# Patient Record
Sex: Female | Born: 1985 | Marital: Single | Smoking: Never smoker
Health system: Southern US, Community
[De-identification: ages and names within clinical notes are randomized; demographics above are authoritative.]

## PROBLEM LIST (undated history)

## (undated) DIAGNOSIS — L4050 Arthropathic psoriasis, unspecified: Secondary | ICD-10-CM

## (undated) HISTORY — DX: Arthropathic psoriasis, unspecified: L40.50

## (undated) HISTORY — PX: NO PAST SURGERIES: SHX2092

---

## 2013-12-05 DIAGNOSIS — L4050 Arthropathic psoriasis, unspecified: Secondary | ICD-10-CM | POA: Insufficient documentation

## 2019-10-14 NOTE — L&D Delivery Note (Signed)
Delivery Note At 4:55 AM a viable female was delivered via Vaginal, Spontaneous (Presentation: Left Occiput Anterior).  APGAR:8,9; weight 3260g.   Placenta status: Spontaneous,  .  Cord: 3 vessels with the following complications: None.   Anesthesia: Epidural Episiotomy: none Lacerations: 2nd degree Suture Repair: 2.0 vicryl Est. Blood Loss (mL): 300  Mom to postpartum.  Baby to Couplet care / Skin to Skin.  Delivery: Called to room and patient was complete and pushing. Head delivered LOA. Nuchal and body cord present x1, delivered through. Shoulder and body delivered in usual fashion. Infant with spontaneous cry, placed on mother's abdomen, dried and stimulated. Cord clamped x 2 after 1-minute delay, and cut by myself under my direct supervision. Cord blood drawn. Post placental paraguard IUD placed as per procedure note. Placenta delivered spontaneously with gentle cord traction. Fundus firm with massage and Pitocin. Labia, perineum, vagina, and cervix were inspected, as noted above.    Alric Seton 07/02/2020, 5:20 AM

## 2020-01-05 ENCOUNTER — Ambulatory Visit (INDEPENDENT_AMBULATORY_CARE_PROVIDER_SITE_OTHER): Payer: Medicaid Other | Admitting: Family Medicine

## 2020-01-05 ENCOUNTER — Encounter: Payer: Self-pay | Admitting: Family Medicine

## 2020-01-05 ENCOUNTER — Other Ambulatory Visit: Payer: Self-pay

## 2020-01-05 DIAGNOSIS — Z3687 Encounter for antenatal screening for uncertain dates: Secondary | ICD-10-CM

## 2020-01-05 DIAGNOSIS — Z3401 Encounter for supervision of normal first pregnancy, first trimester: Secondary | ICD-10-CM | POA: Diagnosis not present

## 2020-01-05 DIAGNOSIS — Z3400 Encounter for supervision of normal first pregnancy, unspecified trimester: Secondary | ICD-10-CM

## 2020-01-05 MED ORDER — AMBULATORY NON FORMULARY MEDICATION: 1.0000 | 0 refills | Status: DC

## 2020-01-05 MED ORDER — PRENATAL VITAMIN 27-0.8 MG PO TABS: 0.8000 mg | ORAL_TABLET | Freq: Every day | ORAL | 11 refills | Status: DC

## 2020-01-05 NOTE — Addendum Note (Signed)
Addended by: Mikey Bussing on: 01/05/2020 01:39 PM   Modules accepted: Orders

## 2020-01-05 NOTE — Progress Notes (Signed)
  Subjective:  Nicole King is a G1P0 [redacted]w[redacted]d by [place] today, being seen today for her first obstetrical visit. Desired pregnancy. FOB involved and is patient's partner. Her obstetrical history is significant for first pregnancy. Patient does intend to breast feed. Pregnancy history fully reviewed.  Patient reports no complaints.  BP 118/82   Pulse (!) 104   Ht 5' (1.524 m)   Wt 162 lb 0.6 oz (73.5 kg)   LMP 10/10/2019 (Within Days)   BMI 31.65 kg/m   HISTORY: OB History  Gravida Para Term Preterm AB Living  1            SAB TAB Ectopic Multiple Live Births               # Outcome Date GA Lbr Len/2nd Weight Sex Delivery Anes PTL Lv  1 Current             Past Medical History:  Diagnosis Date  . Psoriatic arthritis (HCC)     History reviewed. No pertinent surgical history.  History reviewed. No pertinent family history.   Exam  BP 118/82   Pulse (!) 104   Ht 5' (1.524 m)   Wt 162 lb 0.6 oz (73.5 kg)   LMP 10/10/2019 (Within Days)   BMI 31.65 kg/m   Chaperone present during exam  CONSTITUTIONAL: Well-developed, well-nourished female in no acute distress.  HENT:  Normocephalic, atraumatic, External right and left ear normal. Oropharynx is clear and moist EYES: Conjunctivae and EOM are normal. Pupils are equal, round, and reactive to light. No scleral icterus.  NECK: Normal range of motion, supple, no masses.  Normal thyroid.  CARDIOVASCULAR: Normal heart rate noted, regular rhythm RESPIRATORY: Clear to auscultation bilaterally. Effort and breath sounds normal, no problems with respiration noted. BREASTS: patient declined exam today - wanted female provider ABDOMEN: Soft, normal bowel sounds, no distention noted.  No tenderness, rebound or guarding.  PELVIC: patient declined exam today - wanted female provider MUSCULOSKELETAL: Normal range of motion. No tenderness.  No cyanosis, clubbing, or edema.  2+ distal pulses. SKIN: Skin is warm and dry. No rash noted. Not  diaphoretic. No erythema. No pallor. NEUROLOGIC: Alert and oriented to person, place, and time. Normal reflexes, muscle tone coordination. No cranial nerve deficit noted. PSYCHIATRIC: Normal mood and affect. Normal behavior. Normal judgment and thought content.    Assessment:    Pregnancy: G1P0 Patient Active Problem List   Diagnosis Date Noted  . Supervision of normal first pregnancy 01/05/2020      Plan:   1. Supervision of normal first pregnancy, antepartum Desires panorama for genetic screening Will schedule [place]. PAP next visit. Discussed diet, avoiding certain foods.  No pets at home. - Culture, OB Urine - Obstetric Panel, Including HIV - SMN1 COPY NUMBER ANALYSIS (SMA Carrier Screen) - Hemoglobinopathy Evaluation - [place] MFM OB COMP + 14 WK; Future - CHL AMB BABYSCRIPTS SCHEDULE OPTIMIZATION - Hepatitis C Antibody - Genetic Screening     Problem list reviewed and updated. 75% of 30 min visit spent on counseling and coordination of care.     Levie Heritage 01/05/2020

## 2020-01-05 NOTE — Progress Notes (Signed)
DATING AND VIABILITY SONOGRAM   Nicole King is a 34 y.o. year old G1P0 with LMP Patient's last menstrual period was 10/13/2019 (exact date). which would correlate to  [redacted]w[redacted]d weeks gestation.  She has regular menstrual cycles.   She is here today for a confirmatory initial sonogram.    GESTATION: SINGLETON    FETAL ACTIVITY:          Heart rate        154 bpm          The fetus is active.     GESTATIONAL AGE AND  BIOMETRICS:  Gestational criteria: Estimated Date of Delivery: 07/19/20 by LMP (unsure of exact date) now at [redacted]w[redacted]d  Previous Scans:0      BPD 3.03 cm 15.4 wks  HC 8.78 cm 13.6wks   FL 1.52 14.3 wks                                                                       AVERAGE EGA(BY THIS SCAN):  14.4 weeks  WORKING EDD( early ultrasound ): 07-01-2020     TECHNICIAN COMMENTS: Patient informed that the ultrasound is considered a limited obstetric ultrasound and is not intended to be a complete ultrasound exam. Patient also informed that the ultrasound is not being completed with the intent of assessing for fetal or placental anomalies or any pelvic abnormalities. Explained that the purpose of today's ultrasound is to assess for fetal heart rate. Patient acknowledges the purpose of the exam and the limitations of the study.     Armandina Stammer 01/05/2020 11:31 AM

## 2020-01-09 ENCOUNTER — Encounter: Payer: Self-pay | Admitting: Family Medicine

## 2020-01-09 DIAGNOSIS — R8271 Bacteriuria: Secondary | ICD-10-CM | POA: Insufficient documentation

## 2020-01-09 LAB — URINE CULTURE, OB REFLEX

## 2020-01-09 LAB — CULTURE, OB URINE

## 2020-01-12 LAB — OBSTETRIC PANEL, INCLUDING HIV
Antibody Screen: NEGATIVE
Basophils Absolute: 0 10*3/uL (ref 0.0–0.2)
Basos: 0 %
EOS (ABSOLUTE): 0.1 10*3/uL (ref 0.0–0.4)
Eos: 1 %
HIV Screen 4th Generation wRfx: NONREACTIVE
Hematocrit: 39.3 % (ref 34.0–46.6)
Hemoglobin: 13.5 g/dL (ref 11.1–15.9)
Hepatitis B Surface Ag: NEGATIVE
Immature Grans (Abs): 0.2 10*3/uL — ABNORMAL HIGH (ref 0.0–0.1)
Immature Granulocytes: 2 %
Lymphocytes Absolute: 1.5 10*3/uL (ref 0.7–3.1)
Lymphs: 13 %
MCH: 32.6 pg (ref 26.6–33.0)
MCHC: 34.4 g/dL (ref 31.5–35.7)
MCV: 95 fL (ref 79–97)
Monocytes Absolute: 0.5 10*3/uL (ref 0.1–0.9)
Monocytes: 4 %
Neutrophils Absolute: 8.9 10*3/uL — ABNORMAL HIGH (ref 1.4–7.0)
Neutrophils: 80 %
Platelets: 363 10*3/uL (ref 150–450)
RBC: 4.14 x10E6/uL (ref 3.77–5.28)
RDW: 11.4 % — ABNORMAL LOW (ref 11.7–15.4)
RPR Ser Ql: NONREACTIVE
Rh Factor: POSITIVE
Rubella Antibodies, IGG: 1.35 index (ref >0.99)
WBC: 11.2 10*3/uL — ABNORMAL HIGH (ref 3.4–10.8)

## 2020-01-12 LAB — HEMOGLOBPATHY+FER W/A THAL RFX
Ferritin: 349 ng/mL — ABNORMAL HIGH (ref 15–150)
Hgb A2: 2.7 % (ref 1.8–3.2)
Hgb A: 97.3 % (ref 96.4–98.8)
Hgb F: 0 % (ref 0.0–2.0)
Hgb S: 0 %

## 2020-01-12 LAB — SMN1 COPY NUMBER ANALYSIS (SMA CARRIER SCREENING)

## 2020-01-12 LAB — HEPATITIS C ANTIBODY: Hep C Virus Ab: <0.1 s/co ratio (ref 0.0–0.9)

## 2020-01-18 ENCOUNTER — Telehealth: Payer: Self-pay

## 2020-01-18 NOTE — Telephone Encounter (Signed)
Attempted to reach patient by phone for panorama results. Armandina Stammer RN

## 2020-01-30 ENCOUNTER — Encounter: Payer: Self-pay | Admitting: Advanced Practice Midwife

## 2020-02-06 ENCOUNTER — Ambulatory Visit (HOSPITAL_COMMUNITY): Payer: Medicaid Other

## 2020-02-08 ENCOUNTER — Encounter: Payer: Medicaid Other | Admitting: Obstetrics & Gynecology

## 2020-02-23 ENCOUNTER — Other Ambulatory Visit: Payer: Self-pay | Admitting: *Deleted

## 2020-02-23 ENCOUNTER — Ambulatory Visit (INDEPENDENT_AMBULATORY_CARE_PROVIDER_SITE_OTHER): Payer: Medicaid Other | Admitting: Obstetrics & Gynecology

## 2020-02-23 ENCOUNTER — Other Ambulatory Visit: Payer: Self-pay

## 2020-02-23 ENCOUNTER — Ambulatory Visit: Payer: Medicaid Other | Attending: Family Medicine

## 2020-02-23 VITALS — BP 127/86 | HR 114 | Wt 162.0 lb

## 2020-02-23 DIAGNOSIS — E669 Obesity, unspecified: Secondary | ICD-10-CM

## 2020-02-23 DIAGNOSIS — Z362 Encounter for other antenatal screening follow-up: Secondary | ICD-10-CM

## 2020-02-23 DIAGNOSIS — Z3400 Encounter for supervision of normal first pregnancy, unspecified trimester: Secondary | ICD-10-CM | POA: Diagnosis present

## 2020-02-23 DIAGNOSIS — Z3687 Encounter for antenatal screening for uncertain dates: Secondary | ICD-10-CM | POA: Diagnosis not present

## 2020-02-23 DIAGNOSIS — Z3A21 21 weeks gestation of pregnancy: Secondary | ICD-10-CM

## 2020-02-23 DIAGNOSIS — Z363 Encounter for antenatal screening for malformations: Secondary | ICD-10-CM | POA: Diagnosis not present

## 2020-02-23 DIAGNOSIS — R8271 Bacteriuria: Secondary | ICD-10-CM

## 2020-02-23 DIAGNOSIS — O99212 Obesity complicating pregnancy, second trimester: Secondary | ICD-10-CM

## 2020-02-23 MED ORDER — PRENATAL VITAMIN 27-0.8 MG PO TABS: 0.8000 mg | ORAL_TABLET | Freq: Every day | ORAL | 11 refills | Status: DC

## 2020-02-23 NOTE — Patient Instructions (Signed)

## 2020-02-23 NOTE — Progress Notes (Addendum)
   PRENATAL VISIT NOTE  Subjective:  Nicole King is a 34 y.o. G1P0 at [redacted]w[redacted]d being seen today for ongoing prenatal care.  She is currently monitored for the following issues for this low-risk pregnancy and has Supervision of normal first pregnancy and GBS bacteriuria on their problem list.  Patient reports no complaints.  Contractions: Not present. Vag. Bleeding: None.  Movement: Present. Denies leaking of fluid.   The following portions of the patient's history were reviewed and updated as appropriate: allergies, current medications, past family history, past medical history, past social history, past surgical history and problem list.   Objective:   Vitals:   02/23/20 1122  BP: 127/86  Pulse: (!) 114  Weight: 162 lb (73.5 kg)    Fetal Status: Fetal Heart Rate (bpm): 141   Movement: Present     General:  Alert, oriented and cooperative. Patient is in no acute distress.  Skin: Skin is warm and dry. No rash noted.   Cardiovascular: Normal heart rate noted  Respiratory: Normal respiratory effort, no problems with respiration noted  Abdomen: Soft, gravid, appropriate for gestational age.  Pain/Pressure: Absent     Pelvic: Cervical exam deferred        Extremities: Normal range of motion.  Edema: None  Mental Status: Normal mood and affect. Normal behavior. Normal judgment and thought content.   Assessment and Plan:  Pregnancy: G1P0 at [redacted]w[redacted]d 1. Supervision of normal first pregnancy, antepartum FH and FHR WNL  - AFP, Serum, Open Spina Bifida  Scheduled for anatomy scan this afternoon.   2. GBS bacteriuria Reviewed with pt need for atbx in labor.   Preterm labor symptoms and general obstetric precautions including but not limited to vaginal bleeding, contractions, leaking of fluid and fetal movement were reviewed in detail with the patient. Please refer to After Visit Summary for other counseling recommendations.   Return in about 4 weeks (around 03/22/2020) for in  person.  Future Appointments  Date Time Provider Department Center  02/23/2020  1:00 PM [facility] US1 [facility]    Willodean Rosenthal, MD

## 2020-02-25 LAB — AFP, SERUM, OPEN SPINA BIFIDA
AFP MoM: 1.77
AFP Value: 117.4 ng/mL
Gest. Age on Collection Date: 21.4 weeks
Maternal Age At EDD: 34.4 yr
OSBR Risk 1 IN: 1373
Test Results:: NEGATIVE
Weight: 162 [lb_av]

## 2020-03-02 ENCOUNTER — Other Ambulatory Visit: Payer: Self-pay | Admitting: Family Medicine

## 2020-03-02 DIAGNOSIS — Z3400 Encounter for supervision of normal first pregnancy, unspecified trimester: Secondary | ICD-10-CM

## 2020-03-22 ENCOUNTER — Ambulatory Visit: Payer: Medicaid Other | Admitting: *Deleted

## 2020-03-22 ENCOUNTER — Other Ambulatory Visit: Payer: Self-pay

## 2020-03-22 ENCOUNTER — Ambulatory Visit: Payer: Medicaid Other | Attending: Obstetrics and Gynecology

## 2020-03-22 ENCOUNTER — Ambulatory Visit (INDEPENDENT_AMBULATORY_CARE_PROVIDER_SITE_OTHER): Payer: Medicaid Other | Admitting: Family Medicine

## 2020-03-22 VITALS — BP 111/67 | HR 112 | Wt 162.0 lb

## 2020-03-22 VITALS — BP 125/78 | HR 116

## 2020-03-22 DIAGNOSIS — B951 Streptococcus, group B, as the cause of diseases classified elsewhere: Secondary | ICD-10-CM

## 2020-03-22 DIAGNOSIS — Z3400 Encounter for supervision of normal first pregnancy, unspecified trimester: Secondary | ICD-10-CM

## 2020-03-22 DIAGNOSIS — Z3687 Encounter for antenatal screening for uncertain dates: Secondary | ICD-10-CM

## 2020-03-22 DIAGNOSIS — O26892 Other specified pregnancy related conditions, second trimester: Secondary | ICD-10-CM

## 2020-03-22 DIAGNOSIS — O99212 Obesity complicating pregnancy, second trimester: Secondary | ICD-10-CM

## 2020-03-22 DIAGNOSIS — E669 Obesity, unspecified: Secondary | ICD-10-CM

## 2020-03-22 DIAGNOSIS — Z3A25 25 weeks gestation of pregnancy: Secondary | ICD-10-CM

## 2020-03-22 DIAGNOSIS — O26899 Other specified pregnancy related conditions, unspecified trimester: Secondary | ICD-10-CM

## 2020-03-22 DIAGNOSIS — Z3492 Encounter for supervision of normal pregnancy, unspecified, second trimester: Secondary | ICD-10-CM

## 2020-03-22 DIAGNOSIS — R8271 Bacteriuria: Secondary | ICD-10-CM

## 2020-03-22 DIAGNOSIS — G5600 Carpal tunnel syndrome, unspecified upper limb: Secondary | ICD-10-CM

## 2020-03-22 DIAGNOSIS — Z362 Encounter for other antenatal screening follow-up: Secondary | ICD-10-CM | POA: Insufficient documentation

## 2020-03-22 DIAGNOSIS — O98812 Other maternal infectious and parasitic diseases complicating pregnancy, second trimester: Secondary | ICD-10-CM

## 2020-03-22 MED ORDER — WRIST SPLINT/COCK-UP/RIGHT M MISC
1.0000 [IU] | Freq: Every day | 0 refills | Status: DC
Start: 2020-03-22 — End: 2020-07-03

## 2020-03-22 MED ORDER — WRIST SPLINT/COCK-UP/LEFT M MISC
1.0000 [IU] | Freq: Every day | 0 refills | Status: DC
Start: 2020-03-22 — End: 2020-07-03

## 2020-03-22 NOTE — Progress Notes (Signed)
   PRENATAL VISIT NOTE  Subjective:  Nicole King is a 34 y.o. G1P0 at [redacted]w[redacted]d being seen today for ongoing prenatal care.  She is currently monitored for the following issues for this low-risk pregnancy and has Supervision of normal first pregnancy and GBS bacteriuria on their problem list.  Patient reports carpal tunnel symptoms.  Contractions: Not present. Vag. Bleeding: None.  Movement: Present. Denies leaking of fluid.   The following portions of the patient's history were reviewed and updated as appropriate: allergies, current medications, past family history, past medical history, past social history, past surgical history and problem list.   Objective:   Vitals:   03/22/20 1434  BP: 111/67  Pulse: (!) 112  Weight: 162 lb (73.5 kg)    Fetal Status: Fetal Heart Rate (bpm): 144   Movement: Present     General:  Alert, oriented and cooperative. Patient is in no acute distress.  Skin: Skin is warm and dry. No rash noted.   Cardiovascular: Normal heart rate noted  Respiratory: Normal respiratory effort, no problems with respiration noted  Abdomen: Soft, gravid, appropriate for gestational age.  Pain/Pressure: Present     Pelvic: Cervical exam deferred        Extremities: Normal range of motion.  Edema: Trace  Mental Status: Normal mood and affect. Normal behavior. Normal judgment and thought content.   Assessment and Plan:  Pregnancy: G1P0 at [redacted]w[redacted]d 1. Supervision of normal first pregnancy, antepartum FHT and FH normal. 28 wk labs next app.  2. GBS bacteriuria Intrapartum ppx  3. Carpal tunnel syndrome during pregnancy Splints prescribed  Preterm labor symptoms and general obstetric precautions including but not limited to vaginal bleeding, contractions, leaking of fluid and fetal movement were reviewed in detail with the patient. Please refer to After Visit Summary for other counseling recommendations.   Return in about 3 weeks (around 04/12/2020) for OB f/u, 2 hr GTT,  In Office.  Future Appointments  Date Time Provider Department Center  04/12/2020  9:30 AM Levie Heritage, DO [facility] None    Levie Heritage, DO

## 2020-04-12 ENCOUNTER — Ambulatory Visit (INDEPENDENT_AMBULATORY_CARE_PROVIDER_SITE_OTHER): Payer: Medicaid Other | Admitting: Family Medicine

## 2020-04-12 ENCOUNTER — Other Ambulatory Visit: Payer: Self-pay

## 2020-04-12 VITALS — BP 110/85 | HR 113 | Wt 167.0 lb

## 2020-04-12 DIAGNOSIS — R8271 Bacteriuria: Secondary | ICD-10-CM

## 2020-04-12 DIAGNOSIS — O99820 Streptococcus B carrier state complicating pregnancy: Secondary | ICD-10-CM

## 2020-04-12 DIAGNOSIS — Z3400 Encounter for supervision of normal first pregnancy, unspecified trimester: Secondary | ICD-10-CM

## 2020-04-12 DIAGNOSIS — Z3A28 28 weeks gestation of pregnancy: Secondary | ICD-10-CM

## 2020-04-12 NOTE — Progress Notes (Signed)
   PRENATAL VISIT NOTE  Subjective:  Nicole King is a 34 y.o. G1P0 at [redacted]w[redacted]d being seen today for ongoing prenatal care.  She is currently monitored for the following issues for this low-risk pregnancy and has Supervision of normal first pregnancy and GBS bacteriuria on their problem list.  Patient reports no complaints.  Contractions: Not present. Vag. Bleeding: None.  Movement: Present. Denies leaking of fluid.   The following portions of the patient's history were reviewed and updated as appropriate: allergies, current medications, past family history, past medical history, past social history, past surgical history and problem list.   Objective:   Vitals:   04/12/20 0938  BP: 110/85  Pulse: (!) 113  Weight: 167 lb (75.8 kg)    Fetal Status: Fetal Heart Rate (bpm): 141 Fundal Height: 28 cm Movement: Present     General:  Alert, oriented and cooperative. Patient is in no acute distress.  Skin: Skin is warm and dry. No rash noted.   Cardiovascular: Normal heart rate noted  Respiratory: Normal respiratory effort, no problems with respiration noted  Abdomen: Soft, gravid, appropriate for gestational age.  Pain/Pressure: Absent     Pelvic: Cervical exam deferred        Extremities: Normal range of motion.  Edema: Trace  Mental Status: Normal mood and affect. Normal behavior. Normal judgment and thought content.   Assessment and Plan:  Pregnancy: G1P0 at [redacted]w[redacted]d  1. Supervision of normal first pregnancy, antepartum FHT and FH normal  2. GBS bacteriuria Intrapartum ppx   Preterm labor symptoms and general obstetric precautions including but not limited to vaginal bleeding, contractions, leaking of fluid and fetal movement were reviewed in detail with the patient. Please refer to After Visit Summary for other counseling recommendations.   Return in about 2 weeks (around 04/26/2020) for OB f/u.  Future Appointments  Date Time Provider Department Center  04/17/2020  9:30 AM  [facility] NURSE [facility] None    Levie Heritage, DO

## 2020-04-13 ENCOUNTER — Encounter: Payer: Medicaid Other | Admitting: Family Medicine

## 2020-04-17 ENCOUNTER — Ambulatory Visit: Payer: Medicaid Other

## 2020-04-25 ENCOUNTER — Ambulatory Visit (INDEPENDENT_AMBULATORY_CARE_PROVIDER_SITE_OTHER): Payer: Medicaid Other

## 2020-04-25 ENCOUNTER — Other Ambulatory Visit: Payer: Self-pay

## 2020-04-25 DIAGNOSIS — Z3403 Encounter for supervision of normal first pregnancy, third trimester: Secondary | ICD-10-CM

## 2020-04-25 DIAGNOSIS — Z3400 Encounter for supervision of normal first pregnancy, unspecified trimester: Secondary | ICD-10-CM

## 2020-04-25 DIAGNOSIS — Z3A30 30 weeks gestation of pregnancy: Secondary | ICD-10-CM

## 2020-04-25 NOTE — Progress Notes (Signed)
Pt presents for 2 hr GTT. Pt was given lab slip and sent to the lab.  Nicole King l Babette Stum, CMA 

## 2020-04-26 ENCOUNTER — Ambulatory Visit (INDEPENDENT_AMBULATORY_CARE_PROVIDER_SITE_OTHER): Payer: Medicaid Other | Admitting: Family Medicine

## 2020-04-26 VITALS — BP 107/74 | HR 122 | Wt 171.0 lb

## 2020-04-26 DIAGNOSIS — Z3A30 30 weeks gestation of pregnancy: Secondary | ICD-10-CM

## 2020-04-26 DIAGNOSIS — O99820 Streptococcus B carrier state complicating pregnancy: Secondary | ICD-10-CM

## 2020-04-26 DIAGNOSIS — O24410 Gestational diabetes mellitus in pregnancy, diet controlled: Secondary | ICD-10-CM

## 2020-04-26 DIAGNOSIS — R8271 Bacteriuria: Secondary | ICD-10-CM

## 2020-04-26 DIAGNOSIS — Z3400 Encounter for supervision of normal first pregnancy, unspecified trimester: Secondary | ICD-10-CM

## 2020-04-26 LAB — CBC
Hematocrit: 36.3 % (ref 34.0–46.6)
Hemoglobin: 11.8 g/dL (ref 11.1–15.9)
MCH: 29.2 pg (ref 26.6–33.0)
MCHC: 32.5 g/dL (ref 31.5–35.7)
MCV: 90 fL (ref 79–97)
Platelets: 302 10*3/uL (ref 150–450)
RBC: 4.04 x10E6/uL (ref 3.77–5.28)
RDW: 12.6 % (ref 11.7–15.4)
WBC: 10.3 10*3/uL (ref 3.4–10.8)

## 2020-04-26 LAB — GLUCOSE TOLERANCE, 2 HOURS W/ 1HR
Glucose, 1 hour: 247 mg/dL — ABNORMAL HIGH (ref 65–179)
Glucose, 2 hour: 209 mg/dL — ABNORMAL HIGH (ref 65–152)
Glucose, Fasting: 116 mg/dL — ABNORMAL HIGH (ref 65–91)

## 2020-04-26 LAB — RPR: RPR Ser Ql: NONREACTIVE

## 2020-04-26 LAB — HIV ANTIBODY (ROUTINE TESTING W REFLEX): HIV Screen 4th Generation wRfx: NONREACTIVE

## 2020-04-26 MED ORDER — ACCU-CHEK GUIDE W/DEVICE KIT: 1.0000 | PACK | Freq: Once | 0 refills | Status: AC

## 2020-04-26 MED ORDER — ACCU-CHEK SOFTCLIX LANCETS MISC: 100.0000 | Freq: Four times a day (QID) | 12 refills | Status: DC

## 2020-04-26 MED ORDER — ACCU-CHEK GUIDE VI STRP
ORAL_STRIP | 12 refills | Status: DC
Start: 2020-04-26 — End: 2020-07-03

## 2020-04-26 MED FILL — ACCU-CHEK SOFTCLIX LANCETS: 25 days supply | Qty: 100 | Fill #0

## 2020-04-26 MED FILL — ACCU-CHEK GUIDE TEST STRIP: 25 days supply | Qty: 100 | Fill #0

## 2020-04-26 MED FILL — ACCU-CHEK GUIDE W/DEVICE KI: W/DEVICE | 30 days supply | Qty: 1 | Fill #0

## 2020-04-26 NOTE — Progress Notes (Signed)
   PRENATAL VISIT NOTE  Subjective:  Nicole King is a 34 y.o. G1P0 at 7w4dbeing seen today for ongoing prenatal care.  She is currently monitored for the following issues for this high-risk pregnancy and has Supervision of normal first pregnancy; GBS bacteriuria; and Diet controlled gestational diabetes mellitus (GDM), antepartum on their problem list.  Patient reports no complaints.  Contractions: Not present. Vag. Bleeding: None.  Movement: Present. Denies leaking of fluid.   The following portions of the patient's history were reviewed and updated as appropriate: allergies, current medications, past family history, past medical history, past social history, past surgical history and problem list.   Objective:   Vitals:   04/26/20 1559  BP: 107/74  Pulse: (!) 122  Weight: 171 lb (77.6 kg)    Fetal Status: Fetal Heart Rate (bpm): 144   Movement: Present     General:  Alert, oriented and cooperative. Patient is in no acute distress.  Skin: Skin is warm and dry. No rash noted.   Cardiovascular: Normal heart rate noted  Respiratory: Normal respiratory effort, no problems with respiration noted  Abdomen: Soft, gravid, appropriate for gestational age.  Pain/Pressure: Present     Pelvic: Cervical exam deferred        Extremities: Normal range of motion.  Edema: Trace  Mental Status: Normal mood and affect. Normal behavior. Normal judgment and thought content.   Assessment and Plan:  Pregnancy: G1P0 at 322w4d. Supervision of normal first pregnancy, antepartum FHT and FH normal  2. Diet controlled gestational diabetes mellitus (GDM), antepartum Discussed diagnosis. Start testing. Discussed goals - glucose blood (ACCU-CHEK GUIDE) test strip; DX: O24.419 check BS QID  Dispense: 100 each; Refill: 12 - Blood Glucose Monitoring Suppl (ACCU-CHEK GUIDE) w/Device KIT; 1 Device by Does not apply route once for 1 dose. DX: O24.419 check BS QID  Dispense: 1 kit; Refill: 0 - Accu-Chek  Softclix Lancets lancets; 100 each by Other route 4 (four) times daily. DX: O24.419 check BS QID  Dispense: 100 each; Refill: 12 - Ambulatory referral to Nutrition and Diabetic Education  3. GBS bacteriuria Intrapartum PPx  Preterm labor symptoms and general obstetric precautions including but not limited to vaginal bleeding, contractions, leaking of fluid and fetal movement were reviewed in detail with the patient. Please refer to After Visit Summary for other counseling recommendations.   Return in about 2 weeks (around 05/10/2020) for OB f/u.  Future Appointments  Date Time Provider [place]7/27/2021 11:10 AM WiSeabron SpatesCNM [facility] None  05/22/2020 11:05 AM Rasch, JeArtist PaisNP [facility] None    JaTruett MainlandDO

## 2020-05-08 ENCOUNTER — Encounter: Payer: Medicaid Other | Admitting: Advanced Practice Midwife

## 2020-05-17 ENCOUNTER — Ambulatory Visit (INDEPENDENT_AMBULATORY_CARE_PROVIDER_SITE_OTHER): Payer: Medicaid Other | Admitting: Family Medicine

## 2020-05-17 ENCOUNTER — Other Ambulatory Visit: Payer: Self-pay

## 2020-05-17 VITALS — BP 126/81 | HR 109 | Wt 170.0 lb

## 2020-05-17 DIAGNOSIS — Z3400 Encounter for supervision of normal first pregnancy, unspecified trimester: Secondary | ICD-10-CM

## 2020-05-17 DIAGNOSIS — O24415 Gestational diabetes mellitus in pregnancy, controlled by oral hypoglycemic drugs: Secondary | ICD-10-CM

## 2020-05-17 DIAGNOSIS — R8271 Bacteriuria: Secondary | ICD-10-CM

## 2020-05-17 MED ORDER — METFORMIN HCL 500 MG PO TABS: 500.0000 mg | ORAL_TABLET | Freq: Every day | ORAL | 5 refills | Status: DC

## 2020-05-17 MED FILL — ACCU-CHEK GUIDE TEST STRIP: 25 days supply | Qty: 100 | Fill #1

## 2020-05-17 MED FILL — METFORMIN HCL 500 MG TABS: 500 | 30 days supply | Qty: 30 | Fill #0

## 2020-05-17 NOTE — Progress Notes (Signed)
Patient refused her Diabetes and nutrition appointment. Patient has been taking her blood sugars and has blood sugar log. Patient also having trouble with her psorosis on her belly. Armandina Stammer RN

## 2020-05-17 NOTE — Progress Notes (Signed)
   PRENATAL VISIT NOTE  Subjective:  Nicole King is a 34 y.o. G1P0 at [redacted]w[redacted]d being seen today for ongoing prenatal care.  She is currently monitored for the following issues for this high-risk pregnancy and has Supervision of normal first pregnancy; GBS bacteriuria; and Diet controlled gestational diabetes mellitus (GDM), antepartum on their problem list.  Patient reports occasional contractions.  Contractions: Not present. Vag. Bleeding: None.  Movement: Present. Denies leaking of fluid.   GDM: Patient diet controlled.  Fasting: initally, fastings above 100, but was snacking in the middle of the night. 2hr PP: mostly in the 130s-140s.    The following portions of the patient's history were reviewed and updated as appropriate: allergies, current medications, past family history, past medical history, past social history, past surgical history and problem list.   Objective:   Vitals:   05/17/20 1555  BP: 126/81  Pulse: (!) 109  Weight: 170 lb (77.1 kg)    Fetal Status: Fetal Heart Rate (bpm): 145   Movement: Present     General:  Alert, oriented and cooperative. Patient is in no acute distress.  Skin: Skin is warm and dry. No rash noted.   Cardiovascular: Normal heart rate noted  Respiratory: Normal respiratory effort, no problems with respiration noted  Abdomen: Soft, gravid, appropriate for gestational age.  Pain/Pressure: Present     Pelvic: Cervical exam deferred        Extremities: Normal range of motion.  Edema: Trace  Mental Status: Normal mood and affect. Normal behavior. Normal judgment and thought content.   Assessment and Plan:  Pregnancy: G1P0 at [redacted]w[redacted]d 1. Supervision of normal first pregnancy, antepartum FHT and FH normal  2. GBS bacteriuria Intrapartum PPx  3. Gestational diabetes mellitus (GDM) controlled on oral hypoglycemic drug, antepartum Discussed improving diet. Will start metformin 500mg  with breakfast. Start weekly BPP. Induce at 39 weeks. -  MFM FETAL BPP WO NON STRESS; Future  Preterm labor symptoms and general obstetric precautions including but not limited to vaginal bleeding, contractions, leaking of fluid and fetal movement were reviewed in detail with the patient. Please refer to After Visit Summary for other counseling recommendations.   Return in about 2 weeks (around 05/31/2020).  Future Appointments  Date Time Provider Department Center  05/30/2020  2:45 PM 06/01/2020, MD [facility] None    Conan Bowens, DO

## 2020-05-22 ENCOUNTER — Ambulatory Visit: Payer: Medicaid Other

## 2020-05-22 ENCOUNTER — Encounter: Payer: Medicaid Other | Admitting: Obstetrics and Gynecology

## 2020-05-30 ENCOUNTER — Ambulatory Visit (INDEPENDENT_AMBULATORY_CARE_PROVIDER_SITE_OTHER): Payer: Medicaid Other | Admitting: Obstetrics and Gynecology

## 2020-05-30 ENCOUNTER — Ambulatory Visit (HOSPITAL_BASED_OUTPATIENT_CLINIC_OR_DEPARTMENT_OTHER): Payer: Medicaid Other

## 2020-05-30 ENCOUNTER — Encounter: Payer: Self-pay | Admitting: Obstetrics and Gynecology

## 2020-05-30 ENCOUNTER — Other Ambulatory Visit: Payer: Self-pay | Admitting: *Deleted

## 2020-05-30 ENCOUNTER — Other Ambulatory Visit: Payer: Self-pay

## 2020-05-30 ENCOUNTER — Ambulatory Visit: Payer: Medicaid Other | Attending: Family Medicine | Admitting: *Deleted

## 2020-05-30 VITALS — BP 120/77 | HR 117

## 2020-05-30 VITALS — BP 122/80 | HR 110 | Wt 169.0 lb

## 2020-05-30 DIAGNOSIS — Z3A35 35 weeks gestation of pregnancy: Secondary | ICD-10-CM

## 2020-05-30 DIAGNOSIS — O24415 Gestational diabetes mellitus in pregnancy, controlled by oral hypoglycemic drugs: Secondary | ICD-10-CM | POA: Diagnosis not present

## 2020-05-30 DIAGNOSIS — E669 Obesity, unspecified: Secondary | ICD-10-CM | POA: Insufficient documentation

## 2020-05-30 DIAGNOSIS — Z362 Encounter for other antenatal screening follow-up: Secondary | ICD-10-CM | POA: Diagnosis not present

## 2020-05-30 DIAGNOSIS — O99213 Obesity complicating pregnancy, third trimester: Secondary | ICD-10-CM | POA: Diagnosis not present

## 2020-05-30 DIAGNOSIS — Z3403 Encounter for supervision of normal first pregnancy, third trimester: Secondary | ICD-10-CM

## 2020-05-30 DIAGNOSIS — R8271 Bacteriuria: Secondary | ICD-10-CM

## 2020-05-30 DIAGNOSIS — O24410 Gestational diabetes mellitus in pregnancy, diet controlled: Secondary | ICD-10-CM

## 2020-05-30 MED FILL — ACCU-CHEK SOFTCLIX LANCETS: 25 days supply | Qty: 100 | Fill #1

## 2020-05-30 NOTE — Progress Notes (Signed)
   PRENATAL VISIT NOTE  Subjective:  Nicole King is a 34 y.o. G1P0 at [redacted]w[redacted]d being seen today for ongoing prenatal care.  She is currently monitored for the following issues for this high-risk pregnancy and has Supervision of normal first pregnancy; GBS bacteriuria; and Diet controlled gestational diabetes mellitus (GDM), antepartum on their problem list.  Patient reports occasional contractions.  Contractions: Irregular. Vag. Bleeding: None.  Movement: Present. Denies leaking of fluid.   The following portions of the patient's history were reviewed and updated as appropriate: allergies, current medications, past family history, past medical history, past social history, past surgical history and problem list.   Objective:   Vitals:   05/30/20 1433  BP: 122/80  Pulse: (!) 110  Weight: 169 lb (76.7 kg)    Fetal Status: Fetal Heart Rate (bpm): 141   Movement: Present     General:  Alert, oriented and cooperative. Patient is in no acute distress.  Skin: Skin is warm and dry. No rash noted.   Cardiovascular: Normal heart rate noted  Respiratory: Normal respiratory effort, no problems with respiration noted  Abdomen: Soft, gravid, appropriate for gestational age.  Pain/Pressure: Present     Pelvic: Cervical exam deferred        Extremities: Normal range of motion.  Edema: Trace  Mental Status: Normal mood and affect. Normal behavior. Normal judgment and thought content.   Assessment and Plan:  Pregnancy: G1P0 at [redacted]w[redacted]d  1. Encounter for supervision of normal first pregnancy in third trimester [place] here for care, encouraged patient to get medical records to keep copy so if she presents to outside facility, she will have copy of records  2. GBS bacteriuria ppx in labor  3. Diet controlled gestational diabetes mellitus (GDM), antepartum Reviewed paper log, all within range, pt reports she is not taking metformin and is monitoring her  diet Cont diet control  4. [redacted] weeks gestation of pregnancy    Preterm labor symptoms and general obstetric precautions including but not limited to vaginal bleeding, contractions, leaking of fluid and fetal movement were reviewed in detail with the patient. Please refer to After Visit Summary for other counseling recommendations.   Return in about 1 week (around 06/06/2020) for high OB, in person.  Future Appointments  Date Time Provider Department Center  06/06/2020  1:00 PM Reva Bores, MD [facility] None  06/06/2020  3:15 PM [facility] US2 [facility]  06/13/2020  1:30 PM [facility] US[facility]  06/20/2020  1:45 PM [facility] US6 [facility]    Conan Bowens, MD

## 2020-05-30 NOTE — Progress Notes (Signed)
Pt psoriasis has gotten worse on stomach

## 2020-06-06 ENCOUNTER — Other Ambulatory Visit (HOSPITAL_COMMUNITY)
Admission: RE | Admit: 2020-06-06 | Discharge: 2020-06-06 | Disposition: A | Discharge status: Home / Self Care | Payer: Medicaid Other | Source: Ambulatory Visit | Attending: Family Medicine | Admitting: Family Medicine

## 2020-06-06 ENCOUNTER — Ambulatory Visit: Payer: Medicaid Other | Admitting: *Deleted

## 2020-06-06 ENCOUNTER — Other Ambulatory Visit: Payer: Self-pay

## 2020-06-06 ENCOUNTER — Encounter: Payer: Self-pay | Admitting: Family Medicine

## 2020-06-06 ENCOUNTER — Ambulatory Visit (INDEPENDENT_AMBULATORY_CARE_PROVIDER_SITE_OTHER): Payer: Medicaid Other | Admitting: Family Medicine

## 2020-06-06 ENCOUNTER — Ambulatory Visit: Payer: Medicaid Other | Attending: Maternal & Fetal Medicine

## 2020-06-06 VITALS — BP 124/71 | HR 105 | Wt 165.0 lb

## 2020-06-06 DIAGNOSIS — O99820 Streptococcus B carrier state complicating pregnancy: Secondary | ICD-10-CM

## 2020-06-06 DIAGNOSIS — O99213 Obesity complicating pregnancy, third trimester: Secondary | ICD-10-CM | POA: Diagnosis not present

## 2020-06-06 DIAGNOSIS — Z7984 Long term (current) use of oral hypoglycemic drugs: Secondary | ICD-10-CM | POA: Diagnosis not present

## 2020-06-06 DIAGNOSIS — Z3403 Encounter for supervision of normal first pregnancy, third trimester: Secondary | ICD-10-CM | POA: Diagnosis present

## 2020-06-06 DIAGNOSIS — Z3A36 36 weeks gestation of pregnancy: Secondary | ICD-10-CM

## 2020-06-06 DIAGNOSIS — O24410 Gestational diabetes mellitus in pregnancy, diet controlled: Secondary | ICD-10-CM

## 2020-06-06 DIAGNOSIS — R8271 Bacteriuria: Secondary | ICD-10-CM

## 2020-06-06 DIAGNOSIS — O24415 Gestational diabetes mellitus in pregnancy, controlled by oral hypoglycemic drugs: Secondary | ICD-10-CM

## 2020-06-06 LAB — POCT URINALYSIS DIPSTICK OB
Glucose, UA: NEGATIVE
Ketones, UA: NEGATIVE
Leukocytes, UA: NEGATIVE
Nitrite, UA: NEGATIVE
Spec Grav, UA: 1.02 (ref 1.010–1.025)
pH, UA: 7 (ref 5.0–8.0)

## 2020-06-06 NOTE — Progress Notes (Addendum)
   PRENATAL VISIT NOTE  Subjective:  Nicole King is a 34 y.o. G1P0 at [redacted]w[redacted]d being seen today for ongoing prenatal care.  She is currently monitored for the following issues for this low-risk pregnancy and has Supervision of normal first pregnancy; GBS bacteriuria; Diet controlled gestational diabetes mellitus (GDM), antepartum; and Psoriasis with arthropathy (HCC) on their problem list.  Patient reports joint tenderness, stiffness.  Contractions: Irregular. Vag. Bleeding: None.  Movement: Present. Denies leaking of fluid.   The following portions of the patient's history were reviewed and updated as appropriate: allergies, current medications, past family history, past medical history, past social history, past surgical history and problem list.   Objective:   Vitals:   06/06/20 1303  BP: 124/71  Pulse: (!) 105  Weight: 165 lb (74.8 kg)    Fetal Status: Fetal Heart Rate (bpm): 140 Fundal Height: 33 cm Movement: Present  Presentation: Vertex  General:  Alert, oriented and cooperative. Patient is in no acute distress.  Skin: Psoriatic plaques noted diffusely  Cardiovascular: Normal heart rate noted  Respiratory: Normal respiratory effort, no problems with respiration noted  Abdomen: Soft, gravid, appropriate for gestational age.  Pain/Pressure: Present     Pelvic: Cervical exam deferred        Extremities: Normal range of motion.  Edema: None  Mental Status: Normal mood and affect. Normal behavior. Normal judgment and thought content.   Assessment and Plan:  Pregnancy: G1P0 at [redacted]w[redacted]d 1. [redacted] weeks gestation of pregnancy   2. Diet controlled gestational diabetes mellitus (GDM), antepartum No book, reports CBGs are improved with diet - POC Urinalysis Dipstick OB  3. Encounter for supervision of normal first pregnancy in third trimester GBS in urine-->needs treatment in labor - GC/Chlamydia probe amp ()not at [facility] in [place] 3 hours from here--plans delivery  here for now--consider IOL at 39 wks due to this--she will consdier  4. GBS bacteriuria   Preterm labor symptoms and general obstetric precautions including but not limited to vaginal bleeding, contractions, leaking of fluid and fetal movement were reviewed in detail with the patient. Please refer to After Visit Summary for other counseling recommendations.   Return in 1 week (on 06/13/2020) for in person.  Future Appointments  Date Time Provider Department Center  06/14/2020  1:45 PM Levie Heritage, DO [facility] None    Reva Bores, MD

## 2020-06-06 NOTE — Patient Instructions (Signed)

## 2020-06-07 LAB — GC/CHLAMYDIA PROBE AMP (~~LOC~~) NOT AT ARMC
Chlamydia: NEGATIVE
Comment: NEGATIVE
Comment: NORMAL
Neisseria Gonorrhea: NEGATIVE

## 2020-06-13 ENCOUNTER — Ambulatory Visit: Payer: Medicaid Other

## 2020-06-14 ENCOUNTER — Other Ambulatory Visit: Payer: Self-pay

## 2020-06-14 ENCOUNTER — Ambulatory Visit (INDEPENDENT_AMBULATORY_CARE_PROVIDER_SITE_OTHER): Payer: Medicaid Other | Admitting: Family Medicine

## 2020-06-14 VITALS — BP 129/81 | HR 108 | Wt 166.1 lb

## 2020-06-14 DIAGNOSIS — R8271 Bacteriuria: Secondary | ICD-10-CM

## 2020-06-14 DIAGNOSIS — L4050 Arthropathic psoriasis, unspecified: Secondary | ICD-10-CM

## 2020-06-14 DIAGNOSIS — O24410 Gestational diabetes mellitus in pregnancy, diet controlled: Secondary | ICD-10-CM

## 2020-06-14 DIAGNOSIS — Z3403 Encounter for supervision of normal first pregnancy, third trimester: Secondary | ICD-10-CM

## 2020-06-14 MED FILL — ACCU-CHEK GUIDE TEST STRIP: 25 days supply | Qty: 100 | Fill #2

## 2020-06-14 NOTE — Progress Notes (Signed)
Pt. Presents for ROB, [redacted]w[redacted]d.

## 2020-06-14 NOTE — Progress Notes (Signed)
   PRENATAL VISIT NOTE  Subjective:  Nicole King is a 34 y.o. G1P0 at [redacted]w[redacted]d being seen today for ongoing prenatal care.  She is currently monitored for the following issues for this high-risk pregnancy and has Supervision of normal first pregnancy; GBS bacteriuria; Diet controlled gestational diabetes mellitus (GDM), antepartum; and Psoriasis with arthropathy (HCC) on their problem list.  Patient reports no complaints.  Contractions: Irregular. Vag. Bleeding: None.  Movement: Present. Denies leaking of fluid.   The following portions of the patient's history were reviewed and updated as appropriate: allergies, current medications, past family history, past medical history, past social history, past surgical history and problem list.   Objective:   Vitals:   06/14/20 1340  BP: 129/81  Pulse: (!) 108  Weight: 166 lb 1.3 oz (75.3 kg)    Fetal Status: Fetal Heart Rate (bpm): 156 Fundal Height: 38 cm Movement: Present     General:  Alert, oriented and cooperative. Patient is in no acute distress.  Skin: Skin is warm and dry. No rash noted.   Cardiovascular: Normal heart rate noted  Respiratory: Normal respiratory effort, no problems with respiration noted  Abdomen: Soft, gravid, appropriate for gestational age.  Pain/Pressure: Present     Pelvic: Cervical exam deferred        Extremities: Normal range of motion.  Edema: None  Mental Status: Normal mood and affect. Normal behavior. Normal judgment and thought content.   Assessment and Plan:  Pregnancy: G1P0 at [redacted]w[redacted]d 1. Encounter for supervision of normal first pregnancy in third trimester FHT and FH normal  2. Diet controlled gestational diabetes mellitus (GDM), antepartum Checked meter - A couple elevated PP numbers, but mostly controlled Not on medications. EFW 60% with AC 90%.  Delivery at 40 weeks  3. Psoriasis with arthropathy (HCC) Using clobetasol cream  4. GBS bacteriuria Intrapartum PPx  Term labor symptoms and  general obstetric precautions including but not limited to vaginal bleeding, contractions, leaking of fluid and fetal movement were reviewed in detail with the patient. Please refer to After Visit Summary for other counseling recommendations.   Return in about 1 week (around 06/21/2020) for OB f/u.  No future appointments.  Levie Heritage, DO

## 2020-06-20 ENCOUNTER — Ambulatory Visit: Payer: Medicaid Other

## 2020-06-21 ENCOUNTER — Other Ambulatory Visit: Payer: Self-pay

## 2020-06-21 ENCOUNTER — Ambulatory Visit (INDEPENDENT_AMBULATORY_CARE_PROVIDER_SITE_OTHER): Payer: Medicaid Other | Admitting: Family Medicine

## 2020-06-21 VITALS — BP 131/85 | HR 112

## 2020-06-21 DIAGNOSIS — R8271 Bacteriuria: Secondary | ICD-10-CM

## 2020-06-21 DIAGNOSIS — L4050 Arthropathic psoriasis, unspecified: Secondary | ICD-10-CM

## 2020-06-21 DIAGNOSIS — Z3400 Encounter for supervision of normal first pregnancy, unspecified trimester: Secondary | ICD-10-CM

## 2020-06-21 DIAGNOSIS — O24410 Gestational diabetes mellitus in pregnancy, diet controlled: Secondary | ICD-10-CM

## 2020-06-21 DIAGNOSIS — Z3A38 38 weeks gestation of pregnancy: Secondary | ICD-10-CM

## 2020-06-21 NOTE — Progress Notes (Signed)
   PRENATAL VISIT NOTE  Subjective:  Nicole King is a 34 y.o. G1P0 at [redacted]w[redacted]d being seen today for ongoing prenatal care.  She is currently monitored for the following issues for this high-risk pregnancy and has Supervision of normal first pregnancy; GBS bacteriuria; Diet controlled gestational diabetes mellitus (GDM), antepartum; and Psoriasis with arthropathy (HCC) on their problem list.  Patient reports no complaints.  Contractions: Irregular. Vag. Bleeding: None.  Movement: Present. Denies leaking of fluid.   The following portions of the patient's history were reviewed and updated as appropriate: allergies, current medications, past family history, past medical history, past social history, past surgical history and problem list.   Objective:   Vitals:   06/21/20 1504  BP: 131/85  Pulse: (!) 112    Fetal Status: Fetal Heart Rate (bpm): 143   Movement: Present     General:  Alert, oriented and cooperative. Patient is in no acute distress.  Skin: Skin is warm and dry. No rash noted.   Cardiovascular: Normal heart rate noted  Respiratory: Normal respiratory effort, no problems with respiration noted  Abdomen: Soft, gravid, appropriate for gestational age.  Pain/Pressure: Present     Pelvic: Cervical exam deferred        Extremities: Normal range of motion.  Edema: Trace  Mental Status: Normal mood and affect. Normal behavior. Normal judgment and thought content.   Assessment and Plan:  Pregnancy: G1P0 at [redacted]w[redacted]d 1. [redacted] weeks gestation of pregnancy  2. Supervision of normal first pregnancy, antepartum FHT and FH normal  3. Diet controlled gestational diabetes mellitus (GDM), antepartum Fasting controlled. 1-2 elevated postprandial values Induce at 40 weeks  4. GBS bacteriuria Intrapartum ppx  5. Psoriasis with arthropathy (HCC)   Term labor symptoms and general obstetric precautions including but not limited to vaginal bleeding, contractions, leaking of fluid and fetal  movement were reviewed in detail with the patient. Please refer to After Visit Summary for other counseling recommendations.   No follow-ups on file.  No future appointments.  Levie Heritage, DO

## 2020-06-24 ENCOUNTER — Other Ambulatory Visit: Payer: Self-pay | Admitting: Family Medicine

## 2020-06-24 DIAGNOSIS — Z3403 Encounter for supervision of normal first pregnancy, third trimester: Secondary | ICD-10-CM

## 2020-06-26 ENCOUNTER — Telehealth (HOSPITAL_COMMUNITY): Payer: Self-pay | Admitting: *Deleted

## 2020-06-26 ENCOUNTER — Telehealth: Payer: Self-pay

## 2020-06-26 NOTE — Telephone Encounter (Signed)
Preadmission screen  

## 2020-06-26 NOTE — Telephone Encounter (Signed)
Pt called stating she can not make it to her pre screening appt on 06/29/20 because she does not have transportation. Pt made aware that [place] provides transportation services. I called to scheduled transportation services for pt. Pt is aware.  Barclay Lennox l Myrka Sylva, CMA

## 2020-06-27 ENCOUNTER — Encounter: Payer: Medicaid Other | Admitting: Obstetrics and Gynecology

## 2020-06-27 ENCOUNTER — Telehealth (HOSPITAL_COMMUNITY): Payer: Self-pay | Admitting: *Deleted

## 2020-06-27 NOTE — Telephone Encounter (Signed)
Preadmission screen  

## 2020-06-29 ENCOUNTER — Other Ambulatory Visit (HOSPITAL_COMMUNITY)
Admission: RE | Admit: 2020-06-29 | Discharge: 2020-06-29 | Disposition: A | Discharge status: Home / Self Care | Payer: Medicaid Other | Source: Ambulatory Visit | Attending: Family Medicine | Admitting: Family Medicine

## 2020-06-29 DIAGNOSIS — Z20822 Contact with and (suspected) exposure to covid-19: Secondary | ICD-10-CM | POA: Insufficient documentation

## 2020-06-29 DIAGNOSIS — Z01812 Encounter for preprocedural laboratory examination: Secondary | ICD-10-CM | POA: Insufficient documentation

## 2020-06-29 LAB — SARS CORONAVIRUS 2 (TAT 6-24 HRS): SARS Coronavirus 2: NEGATIVE

## 2020-07-01 ENCOUNTER — Other Ambulatory Visit: Payer: Self-pay

## 2020-07-01 ENCOUNTER — Inpatient Hospital Stay (HOSPITAL_COMMUNITY)
Admission: AD | Admit: 2020-07-01 | Discharge: 2020-07-03 | DRG: 807 | Disposition: A | Discharge status: Home / Self Care | Payer: Medicaid Other | Attending: Obstetrics and Gynecology | Admitting: Obstetrics and Gynecology

## 2020-07-01 ENCOUNTER — Encounter (HOSPITAL_COMMUNITY): Payer: Self-pay | Admitting: Obstetrics and Gynecology

## 2020-07-01 ENCOUNTER — Inpatient Hospital Stay (HOSPITAL_COMMUNITY): Payer: Medicaid Other | Admitting: Anesthesiology

## 2020-07-01 ENCOUNTER — Inpatient Hospital Stay (HOSPITAL_COMMUNITY): Payer: Medicaid Other

## 2020-07-01 DIAGNOSIS — O99824 Streptococcus B carrier state complicating childbirth: Secondary | ICD-10-CM | POA: Diagnosis present

## 2020-07-01 DIAGNOSIS — Z3A40 40 weeks gestation of pregnancy: Secondary | ICD-10-CM | POA: Diagnosis not present

## 2020-07-01 DIAGNOSIS — Z3400 Encounter for supervision of normal first pregnancy, unspecified trimester: Secondary | ICD-10-CM

## 2020-07-01 DIAGNOSIS — O9972 Diseases of the skin and subcutaneous tissue complicating childbirth: Secondary | ICD-10-CM | POA: Diagnosis present

## 2020-07-01 DIAGNOSIS — Z30430 Encounter for insertion of intrauterine contraceptive device: Secondary | ICD-10-CM | POA: Diagnosis not present

## 2020-07-01 DIAGNOSIS — R8271 Bacteriuria: Secondary | ICD-10-CM | POA: Diagnosis present

## 2020-07-01 DIAGNOSIS — L4050 Arthropathic psoriasis, unspecified: Secondary | ICD-10-CM | POA: Diagnosis present

## 2020-07-01 DIAGNOSIS — Z975 Presence of (intrauterine) contraceptive device: Secondary | ICD-10-CM

## 2020-07-01 DIAGNOSIS — O24410 Gestational diabetes mellitus in pregnancy, diet controlled: Secondary | ICD-10-CM | POA: Diagnosis present

## 2020-07-01 DIAGNOSIS — O24420 Gestational diabetes mellitus in childbirth, diet controlled: Secondary | ICD-10-CM | POA: Diagnosis present

## 2020-07-01 DIAGNOSIS — E669 Obesity, unspecified: Secondary | ICD-10-CM | POA: Diagnosis present

## 2020-07-01 DIAGNOSIS — O99214 Obesity complicating childbirth: Secondary | ICD-10-CM | POA: Diagnosis present

## 2020-07-01 DIAGNOSIS — Z3490 Encounter for supervision of normal pregnancy, unspecified, unspecified trimester: Secondary | ICD-10-CM

## 2020-07-01 DIAGNOSIS — Z880 Allergy status to penicillin: Secondary | ICD-10-CM

## 2020-07-01 DIAGNOSIS — Z3403 Encounter for supervision of normal first pregnancy, third trimester: Secondary | ICD-10-CM

## 2020-07-01 LAB — GLUCOSE, CAPILLARY
Glucose-Capillary: 84 mg/dL (ref 70–99)
Glucose-Capillary: 125 mg/dL — ABNORMAL HIGH (ref 70–99)
Glucose-Capillary: 98 mg/dL (ref 70–99)
Glucose-Capillary: 131 mg/dL — ABNORMAL HIGH (ref 70–99)

## 2020-07-01 LAB — CBC
HCT: 39.5 % (ref 36.0–46.0)
Hemoglobin: 12.7 g/dL (ref 12.0–15.0)
MCH: 29.2 pg (ref 26.0–34.0)
MCHC: 32.2 g/dL (ref 30.0–36.0)
MCV: 90.8 fL (ref 80.0–100.0)
Platelets: 260 10*3/uL (ref 150–400)
RBC: 4.35 MIL/uL (ref 3.87–5.11)
RDW: 13.9 % (ref 11.5–15.5)
WBC: 7.8 10*3/uL (ref 4.0–10.5)
nRBC: 0 % (ref 0.0–0.2)

## 2020-07-01 LAB — TYPE AND SCREEN
ABO/RH(D): B POS
Antibody Screen: NEGATIVE

## 2020-07-01 LAB — RPR: RPR Ser Ql: NONREACTIVE

## 2020-07-01 MED ORDER — CLINDAMYCIN PHOSPHATE 900 MG/50ML IV SOLN
900.0000 mg | Freq: Three times a day (TID) | INTRAVENOUS | Status: DC
  Administered 2020-07-01 – 2020-07-02 (×3): 900 mg via INTRAVENOUS
  Filled 2020-07-01 (×4): qty 50

## 2020-07-01 MED ORDER — OXYCODONE-ACETAMINOPHEN 5-325 MG PO TABS: 2.0000 | ORAL_TABLET | ORAL | Status: DC | PRN

## 2020-07-01 MED ORDER — FENTANYL-BUPIVACAINE-NACL 0.5-0.125-0.9 MG/250ML-% EP SOLN
12.0000 mL/h | EPIDURAL | Status: DC | PRN
  Filled 2020-07-01: qty 250

## 2020-07-01 MED ORDER — ACETAMINOPHEN 325 MG PO TABS: 650.0000 mg | ORAL_TABLET | ORAL | Status: DC | PRN

## 2020-07-01 MED ORDER — MISOPROSTOL 50MCG HALF TABLET: 50.0000 ug | ORAL_TABLET | ORAL | Status: DC

## 2020-07-01 MED ORDER — DIPHENHYDRAMINE HCL 50 MG/ML IJ SOLN: 12.5000 mg | INTRAMUSCULAR | Status: DC | PRN

## 2020-07-01 MED ORDER — CLOBETASOL PROPIONATE 0.05 % EX OINT
TOPICAL_OINTMENT | Freq: Two times a day (BID) | CUTANEOUS | Status: DC | PRN
  Administered 2020-07-01: 1 via TOPICAL
  Filled 2020-07-01: qty 15

## 2020-07-01 MED ORDER — OXYTOCIN BOLUS FROM INFUSION
333.0000 mL | Freq: Once | INTRAVENOUS | Status: AC
  Administered 2020-07-02: 333 mL via INTRAVENOUS

## 2020-07-01 MED ORDER — LORATADINE 10 MG PO TABS
10.0000 mg | ORAL_TABLET | Freq: Every day | ORAL | Status: DC | PRN
  Administered 2020-07-02: 10 mg via ORAL
  Filled 2020-07-01 (×3): qty 1

## 2020-07-01 MED ORDER — MISOPROSTOL 50MCG HALF TABLET
50.0000 ug | ORAL_TABLET | ORAL | Status: DC
  Administered 2020-07-01: 50 ug via ORAL

## 2020-07-01 MED ORDER — EPHEDRINE 5 MG/ML INJ: 10.0000 mg | INTRAVENOUS | Status: DC | PRN

## 2020-07-01 MED ORDER — MISOPROSTOL 50MCG HALF TABLET
ORAL_TABLET | ORAL | Status: AC
  Filled 2020-07-01: qty 1

## 2020-07-01 MED ORDER — WHITE PETROLATUM EX OINT: TOPICAL_OINTMENT | CUTANEOUS | Status: DC | PRN

## 2020-07-01 MED ORDER — LACTATED RINGERS IV SOLN: 500.0000 mL | Freq: Once | INTRAVENOUS | Status: DC

## 2020-07-01 MED ORDER — PHENYLEPHRINE 40 MCG/ML (10ML) SYRINGE FOR IV PUSH (FOR BLOOD PRESSURE SUPPORT)
80.0000 ug | PREFILLED_SYRINGE | INTRAVENOUS | Status: DC | PRN
  Filled 2020-07-01: qty 10

## 2020-07-01 MED ORDER — SOD CITRATE-CITRIC ACID 500-334 MG/5ML PO SOLN: 30.0000 mL | ORAL | Status: DC | PRN

## 2020-07-01 MED ORDER — LIDOCAINE HCL (PF) 1 % IJ SOLN: 30.0000 mL | INTRAMUSCULAR | Status: DC | PRN

## 2020-07-01 MED ORDER — WHITE PETROLATUM EX OINT
TOPICAL_OINTMENT | CUTANEOUS | Status: DC | PRN
  Filled 2020-07-01: qty 5

## 2020-07-01 MED ORDER — TERBUTALINE SULFATE 1 MG/ML IJ SOLN: 0.2500 mg | Freq: Once | INTRAMUSCULAR | Status: DC | PRN

## 2020-07-01 MED ORDER — LACTATED RINGERS IV SOLN: 500.0000 mL | INTRAVENOUS | Status: DC | PRN

## 2020-07-01 MED ORDER — LACTATED RINGERS IV SOLN: INTRAVENOUS | Status: DC

## 2020-07-01 MED ORDER — LIDOCAINE HCL (PF) 1 % IJ SOLN
INTRAMUSCULAR | Status: DC | PRN
  Administered 2020-07-01: 4 mL via EPIDURAL
  Administered 2020-07-01: 3 mL via EPIDURAL

## 2020-07-01 MED ORDER — MISOPROSTOL 25 MCG QUARTER TABLET
25.0000 ug | ORAL_TABLET | ORAL | Status: DC | PRN
  Administered 2020-07-01 (×2): 25 ug via VAGINAL
  Filled 2020-07-01 (×2): qty 1

## 2020-07-01 MED ORDER — ONDANSETRON HCL 4 MG/2ML IJ SOLN
4.0000 mg | Freq: Four times a day (QID) | INTRAMUSCULAR | Status: DC | PRN
  Administered 2020-07-02: 4 mg via INTRAVENOUS
  Filled 2020-07-01: qty 2

## 2020-07-01 MED ORDER — OXYTOCIN-SODIUM CHLORIDE 30-0.9 UT/500ML-% IV SOLN
2.5000 [IU]/h | INTRAVENOUS | Status: DC
  Administered 2020-07-02: 2.5 [IU]/h via INTRAVENOUS
  Filled 2020-07-01: qty 500

## 2020-07-01 MED ORDER — BUPIVACAINE HCL (PF) 0.75 % IJ SOLN
INTRAMUSCULAR | Status: DC | PRN
Start: 2020-07-01 — End: 2020-07-02
  Administered 2020-07-01: 11 mL/h via EPIDURAL

## 2020-07-01 MED ORDER — WHITE PETROLATUM GEL
Status: DC | PRN
  Filled 2020-07-01: qty 71

## 2020-07-01 MED ORDER — OXYCODONE-ACETAMINOPHEN 5-325 MG PO TABS: 1.0000 | ORAL_TABLET | ORAL | Status: DC | PRN

## 2020-07-01 MED ORDER — OXYTOCIN-SODIUM CHLORIDE 30-0.9 UT/500ML-% IV SOLN
1.0000 m[IU]/min | INTRAVENOUS | Status: DC
  Administered 2020-07-01: 2 m[IU]/min via INTRAVENOUS

## 2020-07-01 MED ORDER — PHENYLEPHRINE 40 MCG/ML (10ML) SYRINGE FOR IV PUSH (FOR BLOOD PRESSURE SUPPORT)
80.0000 ug | PREFILLED_SYRINGE | INTRAVENOUS | Status: DC | PRN
  Administered 2020-07-02: 80 ug via INTRAVENOUS

## 2020-07-01 MED ORDER — FENTANYL CITRATE (PF) 100 MCG/2ML IJ SOLN: 50.0000 ug | INTRAMUSCULAR | Status: DC | PRN

## 2020-07-01 NOTE — Progress Notes (Signed)
LABOR PROGRESS NOTE  Nicole King is a 34 y.o. G1P0 at [redacted]w[redacted]d  admitted for IOL due to A1GDM.   Subjective: Doing well, not feeling contractions much yet.   Objective: BP 125/83   Pulse 90   Temp 98.3 F (36.8 C) (Oral)   Resp 16   Ht 5' (1.524 m)   Wt 76.2 kg   LMP 10/10/2019 (Within Days)   BMI 32.81 kg/m  or  Vitals:   07/01/20 0921 07/01/20 1037 07/01/20 1202 07/01/20 1307  BP:  122/87 123/80 125/83  Pulse:  89 87 90  Resp:  18 19 16   Temp: 98 F (36.7 C)   98.3 F (36.8 C)  TempSrc: Oral   Oral  Weight:      Height:        Derm: diffuse erythematous plaques with surrounding flaking dry skin present on bilateral upper and lower extremities with lichenified vulvar region  Dilation: 2.5 Effacement (%): 80 Cervical Position: Posterior Station: -3 Presentation: Vertex Exam by:: Dr [email or web address] FHT: baseline rate 130, moderate varibility, +acel, -decel Toco: every 6-8 min   Labs: Lab Results  Component Value Date   WBC 7.8 07/01/2020   HGB 12.7 07/01/2020   HCT 39.5 07/01/2020   MCV 90.8 07/01/2020   PLT 260 07/01/2020    Patient Active Problem List   Diagnosis Date Noted  . Pregnancy 07/01/2020  . Diet controlled gestational diabetes mellitus (GDM), antepartum 04/26/2020  . GBS bacteriuria 01/09/2020  . Supervision of normal first pregnancy 01/05/2020  . Psoriasis with arthropathy (HCC) 12/05/2013    Assessment / Plan: 34 y.o. G1P0 at [redacted]w[redacted]d here for IOL due to A1GDM.   Labor: Minimal progression since last check and would likely benefit from additional cervical softening, given Cytotec, now s/p 2 doses. Will likely switch to pit 2x2 on next check.  Fetal Wellbeing:  Cat 1 strip  Pain Control:  Desires epidural  Anticipated MOD:  SVD  #A1GDM: Stable.  CBG 84>125. Will monitor q4 with goal <120.   #Psorasis: Chronic.  Severe and diffuse. Vaseline and temovate at bedside. Should resume biologics as soon as possible post-partum.   [redacted]w[redacted]d, DO   Family Medicine PGY-3  07/01/2020, 1:36 PM

## 2020-07-01 NOTE — Progress Notes (Signed)
LABOR PROGRESS NOTE  Nicole King is a 34 y.o. G1P0 at [redacted]w[redacted]d  admitted for IOL due to A1GDM.   Subjective: Doing well without complaints. Feeling contractions more.  Objective: BP 123/83    Pulse 90    Temp 98.3 F (36.8 C) (Oral)    Resp 18    Ht 5' (1.524 m)    Wt 76.2 kg    LMP 10/10/2019 (Within Days)    BMI 32.81 kg/m  or  Vitals:   07/01/20 1727 07/01/20 1924 07/01/20 1925 07/01/20 2013  BP: 136/85  124/82 123/83  Pulse: 87  89 90  Resp: 16  18 18   Temp: 98.2 F (36.8 C) 98.3 F (36.8 C)    TempSrc: Oral Oral    Weight:      Height:        Dilation: 2.5 Effacement (%): 80 Cervical Position: Posterior Station: -3 Presentation: Vertex Exam by:: Dr [email or web address] FHT: baseline rate 150, moderate varibility, +acel, -decel Toco: every 1-4 min   Labs: Lab Results  Component Value Date   WBC 7.8 07/01/2020   HGB 12.7 07/01/2020   HCT 39.5 07/01/2020   MCV 90.8 07/01/2020   PLT 260 07/01/2020    Patient Active Problem List   Diagnosis Date Noted   Pregnancy 07/01/2020   Diet controlled gestational diabetes mellitus (GDM), antepartum 04/26/2020   GBS bacteriuria 01/09/2020   Supervision of normal first pregnancy 01/05/2020   Psoriasis with arthropathy (HCC) 12/05/2013    Assessment / Plan: 34 y.o. G1P0 at [redacted]w[redacted]d here for IOL due to A1GDM.   Labor: S/p cytotec x3. Will recheck at 2140 and likely initiate pitocin at that time pending cervical exam. Fetal Wellbeing:  Cat 1 strip  Pain Control:  PRN, desires epidural  Anticipated MOD:  SVD   #GBS bacteruria: urine culture 01/05/20. PCN allergic, clindamycin. Adequate ppx.  #A1GDM: Stable.  CBG 84>125>98. Will monitor q4 with goal <120.   #Psorasis: Chronic.  Severe and diffuse. Vaseline and temovate at bedside. Should resume biologics as soon as possible post-partum.   01/07/20, MD OB Fellow, Faculty [facility], Center for [facility] 07/01/2020 9:01 PM

## 2020-07-01 NOTE — Anesthesia Preprocedure Evaluation (Addendum)
Anesthesia Evaluation  Patient identified by MRN, date of birth, ID band Patient awake    Reviewed: Allergy & Precautions, Patient's Chart, lab work & pertinent test results  Airway Mallampati: II  TM Distance: >3 FB Neck ROM: Full    Dental no notable dental hx. (+) Teeth Intact   Pulmonary neg pulmonary ROS,    Pulmonary exam normal breath sounds clear to auscultation       Cardiovascular negative cardio ROS Normal cardiovascular exam Rhythm:Regular Rate:Normal     Neuro/Psych negative neurological ROS  negative psych ROS   GI/Hepatic Neg liver ROS, GERD  ,  Endo/Other  diabetes, Well Controlled, GestationalObesity  Renal/GU negative Renal ROS  negative genitourinary   Musculoskeletal  (+) Arthritis , Psoriasis Psoriatic arthritis   Abdominal (+) + obese,   Peds  Hematology negative hematology ROS (+)   Anesthesia Other Findings   Reproductive/Obstetrics (+) Pregnancy                            Anesthesia Physical Anesthesia Plan  ASA: II  Anesthesia Plan: Epidural   Post-op Pain Management:    Induction:   PONV Risk Score and Plan:   Airway Management Planned: Natural Airway  Additional Equipment:   Intra-op Plan:   Post-operative Plan:   Informed Consent: I have reviewed the patients History and Physical, chart, labs and discussed the procedure including the risks, benefits and alternatives for the proposed anesthesia with the patient or authorized representative who has indicated his/her understanding and acceptance.       Plan Discussed with: Anesthesiologist  Anesthesia Plan Comments:         Anesthesia Quick Evaluation

## 2020-07-01 NOTE — Progress Notes (Signed)
LABOR PROGRESS NOTE  Nicole King is a 34 y.o. G1P0 at [redacted]w[redacted]d  admitted for IOL due to A1GDM.   Subjective: Feeling more contractions occasionally.   Objective: BP 136/85   Pulse 87   Temp 98.3 F (36.8 C) (Oral)   Resp 16   Ht 5' (1.524 m)   Wt 76.2 kg   LMP 10/10/2019 (Within Days)   BMI 32.81 kg/m  or  Vitals:   07/01/20 1202 07/01/20 1307 07/01/20 1452 07/01/20 1727  BP: 123/80 125/83 131/81 136/85  Pulse: 87 90 93 87  Resp: 19 16 17 16   Temp:  98.3 F (36.8 C)    TempSrc:  Oral    Weight:      Height:        Dilation: 2.5 Effacement (%): 80 Cervical Position: Posterior Station: -3 Presentation: Vertex Exam by:: Dr [email or web address] FHT: baseline rate 150, moderate varibility, +acel, -decel Toco: every 2-4 min   Labs: Lab Results  Component Value Date   WBC 7.8 07/01/2020   HGB 12.7 07/01/2020   HCT 39.5 07/01/2020   MCV 90.8 07/01/2020   PLT 260 07/01/2020    Patient Active Problem List   Diagnosis Date Noted  . Pregnancy 07/01/2020  . Diet controlled gestational diabetes mellitus (GDM), antepartum 04/26/2020  . GBS bacteriuria 01/09/2020  . Supervision of normal first pregnancy 01/05/2020  . Psoriasis with arthropathy (HCC) 12/05/2013    Assessment / Plan: 34 y.o. G1P0 at [redacted]w[redacted]d here for IOL due to A1GDM.   Labor: Minimal to no progression since last check with tougher cervix, will do buccal cytotec, s/p 3 doses total. Should hopefully transition to pit on next check.  Fetal Wellbeing:  Cat 1 strip  Pain Control:  Desires epidural  Anticipated MOD:  SVD   #A1GDM: Stable.  CBG 84>125>98. Will monitor q4 with goal <120.   #Psorasis: Chronic.  Severe and diffuse. Vaseline and temovate at bedside. Should resume biologics as soon as possible post-partum.   [redacted]w[redacted]d, DO  Family Medicine PGY-3  07/01/2020, 5:55 PM

## 2020-07-01 NOTE — H&P (Addendum)
OBSTETRIC ADMISSION HISTORY AND PHYSICAL  Nicole King is a 34 y.o. female G1P0 with IUP at 86w0dby 14 week U/S presenting for IOL due to A1GDM. She reports +FMs, No LOF, no VB, no blurry vision, headaches or peripheral edema, and RUQ pain.  She plans on breast feeding. She request IUD for birth control. She received her prenatal care at  [facility]    Dating: By 14 wk U/S --->  Estimated Date of Delivery: 07/01/20  Sono:    @[redacted]w[redacted]d , CWD, normal anatomy, cephalic presentation, 4431V 41% EFW   Prenatal History/Complications:  --Psorasis with Psoriatic arthritis, has been using steroid cream and claritin for itching --GBS positive --A1GDM, well controlled   Past Medical History: Past Medical History:  Diagnosis Date   Psoriatic arthritis ([place]     Past Surgical History: Past Surgical History:  Procedure Laterality Date   NO PAST SURGERIES      Obstetrical History: OB History     Gravida  1   Para      Term      Preterm      AB      Living  0      SAB      TAB      Ectopic      Multiple      Live Births              Social History Social History   Socioeconomic History   Marital status: Single    Spouse name: Not on file   Number of children: Not on file   Years of education: Not on file   Highest education level: Not on file  Occupational History   Not on file  Tobacco Use   Smoking status: Never Smoker   Smokeless tobacco: Never Used  Substance and Sexual Activity   Alcohol use: Never   Drug use: Never   Sexual activity: Yes    Birth control/protection: None  Other Topics Concern   Not on file  Social History Narrative   Not on file   Social Determinants of Health   Financial Resource Strain:    Difficulty of Paying Living Expenses: Not on file  Food Insecurity:    Worried About Running Out of Food in the Last Year: Not on file   [employer] World[employer] Food in the Last Year: Not on file  Transportation Needs:    Lack of Transportation  (Medical): Not on file   Lack of Transportation (Non-Medical): Not on file  Physical Activity:    Days of Exercise per Week: Not on file   Minutes of Exercise per Session: Not on file  Stress:    Feeling of Stress : Not on file  Social Connections:    Frequency of Communication with Friends and Family: Not on file   Frequency of Social Gatherings with Friends and Family: Not on file   Attends Religious Services: Not on file   Active Member of Clubs or Organizations: Not on file   Attends CArchivistMeetings: Not on file   Marital Status: Not on file    Family History: History reviewed. No pertinent family history.  Allergies: Allergies  Allergen Reactions   Aspirin Hives   Penicillin G Other (See Comments)    LOW B/P PSORIASIS GOT WORSE    Medications Prior to Admission  Medication Sig Dispense Refill Last Dose   Accu-Chek Softclix Lancets lancets 100 each by Other route 4 (four) times daily. DX: O24.419 check BS  QID (Patient not taking: Reported on 05/30/2020) 100 each 12    AMBULATORY NON FORMULARY MEDICATION 1 Device by Other route once a week. Blood pressure cuff/Medium Monitored Regularly at home  ICD 10 Z34:90 LROB (Patient not taking: Reported on 05/30/2020) 1 kit 0    clobetasol ointment (TEMOVATE) 0.05 % APPLY EXTERNALLY TO THE AFFECTED AREA TWICE DAILY AS NEEDED      Elastic Bandages & Supports (WRIST SPLINT/COCK-UP/LEFT M) MISC 1 Units by Does not apply route at bedtime. (Patient not taking: Reported on 04/12/2020) 1 each 0    Elastic Bandages & Supports (WRIST SPLINT/COCK-UP/RIGHT M) MISC 1 Units by Does not apply route at bedtime. (Patient not taking: Reported on 04/12/2020) 1 each 0    glucose blood (ACCU-CHEK GUIDE) test strip DX: O24.419 check BS QID (Patient not taking: Reported on 05/30/2020) 100 each 12    Prenatal Vit-Fe Fumarate-FA (PRENATAL VITAMIN) 27-0.8 MG TABS Take 0.8 mg by mouth daily. 30 tablet 11    Risankizumab-rzaa,150 MG Dose, (SKYRIZI, 150  MG DOSE,) 75 MG/0.83ML PSKT Inject 127m (2 syringes) into the skin every 12 weeks. (Patient not taking: Reported on 06/14/2020)        Review of Systems   All systems reviewed and negative except as stated in HPI  Height 5' (1.524 m), weight 76.2 kg, last menstrual period 10/10/2019. General appearance: alert, cooperative and no distress Lungs: No increased WOB  Abdomen: soft Extremities: Homans sign is negative, no sign of DVT Presentation: cephalic Fetal monitoringBaseline: 120 bpm, Variability: Good {> 6 bpm), Accelerations: Reactive and Decelerations: Absent Uterine activity Irritable     Prenatal labs: ABO, Rh: B/Positive/-- (03/25 1309) Antibody: Negative (03/25 1309) Rubella: 1.35 (03/25 1309) RPR: Non Reactive (07/14 0942)  HBsAg: Negative (03/25 1309)  HIV: Non Reactive (07/14 0942)  GBS:  positive with bacteruria  Genetic screening NIPS low risk  Anatomy [employer]   Prenatal Transfer Tool  Maternal Diabetes: Yes:  Diabetes Type:  Diet controlled Genetic Screening: Normal Maternal Ultrasounds/Referrals: Normal Fetal Ultrasounds or other Referrals:  None Maternal Substance Abuse:  No Significant Maternal Medications:  None Significant Maternal Lab Results: Group B Strep positive  No results found for this or any previous visit (from the past 24 hour(s)).  Patient Active Problem List   Diagnosis Date Noted   Pregnancy 07/01/2020   Diet controlled gestational diabetes mellitus (GDM), antepartum 04/26/2020   GBS bacteriuria 01/09/2020   Supervision of normal first pregnancy 01/05/2020   [place] 12/05[place]2013    Assessment/Plan:  [name] a 34y.o. G1P0 at 434w0dere for IOL due to A2GDM. Pregnancy complicated by [id number] psoriatic arthritis.   #Labor: Placed cytotec x1.  #Pain: Epidural #FWB:Cat 1  #ID: GBS positive, clindamycin due to penicillin allergy  #MOF: Breastfeeding  #MOC: Paragard post-placental  #Circ:  N/A  #A1GDM: Stable, well controlled.  EFW 60% percentile at 36 weeks.  --Monitor CBG q4 in latent labor  #Psoriatic arthritis: Chronic, poorly controlled.  Has been using temovate PRN. Would like to get back on biologics pp.  --Temovate and claritin PRN during stay   SaPatriciaann ClanDO  07/01/2020, 8:24 AM  I was present for the exam and agree with above.  SmTamala JulianViVermontCNValliant/19/2021 11:16 AM

## 2020-07-01 NOTE — Anesthesia Procedure Notes (Signed)
Epidural Patient location during procedure: OB Start time: 07/01/2020 11:34 PM End time: 07/01/2020 11:44 PM  Staffing Anesthesiologist: Mal Amabile, MD Performed: anesthesiologist   Preanesthetic Checklist Completed: patient identified, IV checked, site marked, risks and benefits discussed, surgical consent, monitors and equipment checked, pre-op evaluation and timeout performed  Epidural Patient position: sitting Prep: DuraPrep and site prepped and draped Patient monitoring: continuous pulse ox and blood pressure Approach: midline Location: L3-L4 Injection technique: LOR air  Needle:  Needle type: Tuohy  Needle gauge: 17 G Needle length: 9 cm and 9 Needle insertion depth: 5 cm cm Catheter type: closed end flexible Catheter size: 19 Gauge Catheter at skin depth: 10 cm Test dose: negative and Other  Assessment Events: blood not aspirated, injection not painful, no injection resistance, no paresthesia and negative IV test  Additional Notes Patient identified. Risks and benefits discussed including failed block, incomplete  Pain control, post dural puncture headache, nerve damage, paralysis, blood pressure Changes, nausea, vomiting, reactions to medications-both toxic and allergic and post Partum back pain. All questions were answered. Patient expressed understanding and wished to proceed. Sterile technique was used throughout procedure. Epidural site was Dressed with sterile barrier dressing. No paresthesias, signs of intravascular injection Or signs of intrathecal spread were encountered.  Patient was more comfortable after the epidural was dosed. Please see RN's note for documentation of vital signs and FHR which are stable. Reason for block:procedure for pain

## 2020-07-02 ENCOUNTER — Encounter (HOSPITAL_COMMUNITY): Payer: Self-pay | Admitting: Obstetrics and Gynecology

## 2020-07-02 DIAGNOSIS — O24420 Gestational diabetes mellitus in childbirth, diet controlled: Secondary | ICD-10-CM

## 2020-07-02 DIAGNOSIS — Z30430 Encounter for insertion of intrauterine contraceptive device: Secondary | ICD-10-CM

## 2020-07-02 DIAGNOSIS — Z3A40 40 weeks gestation of pregnancy: Secondary | ICD-10-CM

## 2020-07-02 DIAGNOSIS — O99824 Streptococcus B carrier state complicating childbirth: Secondary | ICD-10-CM

## 2020-07-02 DIAGNOSIS — Z975 Presence of (intrauterine) contraceptive device: Secondary | ICD-10-CM

## 2020-07-02 LAB — GLUCOSE, CAPILLARY
Glucose-Capillary: 87 mg/dL (ref 70–99)
Glucose-Capillary: 81 mg/dL (ref 70–99)

## 2020-07-02 MED ORDER — COCONUT OIL OIL: 1.0000 "application " | TOPICAL_OIL | Status: DC | PRN

## 2020-07-02 MED ORDER — DIPHENHYDRAMINE HCL 25 MG PO CAPS: 25.0000 mg | ORAL_CAPSULE | Freq: Four times a day (QID) | ORAL | Status: DC | PRN

## 2020-07-02 MED ORDER — SIMETHICONE 80 MG PO CHEW: 80.0000 mg | CHEWABLE_TABLET | ORAL | Status: DC | PRN

## 2020-07-02 MED ORDER — TETANUS-DIPHTH-ACELL PERTUSSIS 5-2.5-18.5 LF-MCG/0.5 IM SUSP: 0.5000 mL | Freq: Once | INTRAMUSCULAR | Status: DC

## 2020-07-02 MED ORDER — SENNOSIDES-DOCUSATE SODIUM 8.6-50 MG PO TABS
2.0000 | ORAL_TABLET | ORAL | Status: DC
  Administered 2020-07-02: 2 via ORAL
  Filled 2020-07-02: qty 2

## 2020-07-02 MED ORDER — ACETAMINOPHEN 325 MG PO TABS
650.0000 mg | ORAL_TABLET | ORAL | Status: DC | PRN
  Administered 2020-07-02 – 2020-07-03 (×2): 650 mg via ORAL
  Filled 2020-07-02 (×3): qty 2

## 2020-07-02 MED ORDER — PARAGARD INTRAUTERINE COPPER IU IUD
INTRAUTERINE_SYSTEM | Freq: Once | INTRAUTERINE | Status: AC
  Administered 2020-07-02: 1 via INTRAUTERINE
  Filled 2020-07-02: qty 1

## 2020-07-02 MED ORDER — ONDANSETRON HCL 4 MG PO TABS: 4.0000 mg | ORAL_TABLET | ORAL | Status: DC | PRN

## 2020-07-02 MED ORDER — WITCH HAZEL-GLYCERIN EX PADS: 1.0000 "application " | MEDICATED_PAD | CUTANEOUS | Status: DC | PRN

## 2020-07-02 MED ORDER — ONDANSETRON HCL 4 MG/2ML IJ SOLN: 4.0000 mg | INTRAMUSCULAR | Status: DC | PRN

## 2020-07-02 MED ORDER — BENZOCAINE-MENTHOL 20-0.5 % EX AERO
1.0000 "application " | INHALATION_SPRAY | CUTANEOUS | Status: DC | PRN
  Administered 2020-07-02: 1 via TOPICAL
  Filled 2020-07-02: qty 56

## 2020-07-02 MED ORDER — DIBUCAINE (PERIANAL) 1 % EX OINT: 1.0000 "application " | TOPICAL_OINTMENT | CUTANEOUS | Status: DC | PRN

## 2020-07-02 MED ORDER — PRENATAL MULTIVITAMIN CH
1.0000 | ORAL_TABLET | Freq: Every day | ORAL | Status: DC
  Administered 2020-07-02 – 2020-07-03 (×2): 1 via ORAL
  Filled 2020-07-02 (×3): qty 1

## 2020-07-02 NOTE — Progress Notes (Signed)
LABOR PROGRESS NOTE  Nicole King is a 34 y.o. G1P0 at [redacted]w[redacted]d  admitted for IOL due to A1GDM.   Subjective: Doing well without complaints.  Objective: BP 102/66    Pulse 97    Temp 98.1 F (36.7 C) (Oral)    Resp 18    Ht 5' (1.524 m)    Wt 76.2 kg    LMP 10/10/2019 (Within Days)    SpO2 99%    BMI 32.81 kg/m  or  Vitals:   07/02/20 0130 07/02/20 0200 07/02/20 0234 07/02/20 0237  BP: 99/62 (!) 97/58  102/66  Pulse: 86 97  97  Resp: 18 16  18   Temp:   98.1 F (36.7 C)   TempSrc:   Oral   SpO2:      Weight:      Height:        Dilation: 10 Dilation Complete Date: 07/02/20 Dilation Complete Time: 0324 Effacement (%): 100 Cervical Position: Posterior Station: 0 Presentation: Vertex Exam by:: [email or web address] RN  FHT: baseline rate 130, moderate varibility, +acel, -decel Toco: every 1-4 min   Labs: Lab Results  Component Value Date   WBC 7.8 07/01/2020   HGB 12.7 07/01/2020   HCT 39.5 07/01/2020   MCV 90.8 07/01/2020   PLT 260 07/01/2020    Patient Active Problem List   Diagnosis Date Noted   Pregnancy 07/01/2020   Diet controlled gestational diabetes mellitus (GDM), antepartum 04/26/2020   GBS bacteriuria 01/09/2020   Supervision of normal first pregnancy 01/05/2020   Psoriasis with arthropathy (HCC) 12/05/2013    Assessment / Plan: 34 y.o. G1P0 at [redacted]w[redacted]d here for IOL due to A1GDM.   Labor: S/p cytotec x3. Patient completely dilated. RN will begin practice pushing at this time.  Fetal Wellbeing:  Cat 1 strip  Pain Control:  CSE  #GBS bacteruria: urine culture 01/05/20. PCN allergic, clindamycin. Adequate ppx.  #A1GDM: Stable.  q2 CBG given active labor.  #Psorasis: Chronic.  Severe and diffuse. Vaseline and temovate at bedside. Should resume biologics as soon as possible post-partum.   01/07/20, MD OB Fellow, Faculty [facility], Center for [facility] 07/02/2020 3:58 AM

## 2020-07-02 NOTE — Discharge Summary (Signed)
Postpartum Discharge Summary      Patient Name: Nicole King DOB: 03-Jul-1986 MRN: [id number]  Date of admission: 07/01/2020 Delivery date:07/02/2020  Delivering provider: Arrie Senate  Date of discharge: 07/03/2020  Admitting diagnosis: Pregnancy [Z34.90] Intrauterine pregnancy: [redacted]w[redacted]d    Secondary diagnosis:  Active Problems:   Supervision of normal first pregnancy   GBS bacteriuria   Diet controlled gestational diabetes mellitus (GDM), antepartum   Psoriasis with arthropathy ([facility]   Pregnancy   Vaginal delivery   Second degree perineal laceration   IUD (intrauterine device) in place  Additional problems: none    Discharge diagnosis: Term Pregnancy Delivered and GDM A1                                              Post partum procedures:PP paraguard IUD placed Augmentation: Pitocin and Cytotec Complications: None  Hospital course: Induction of Labor With Vaginal Delivery   34y.o. yo G1P0 at 454w1das admitted to the hospital 07/01/2020 for induction of labor.  Indication for induction: A1 DM.  Patient had an uncomplicated labor course as follows: Membrane Rupture Time/Date: 9:00 PM ,07/01/2020   Delivery Method:Vaginal, Spontaneous  Episiotomy:   Lacerations:  2nd degree  Details of delivery can be found in separate delivery note.  Patient had a routine postpartum course. Patient is discharged home 07/03/20.  Newborn Data: Birth date:07/02/2020  Birth time:4:55 AM  Gender:Female  Living status:Living  Apgars:8 ,9  Weight:3260 g   Magnesium Sulfate received: No BMZ received: No Rhophylac:N/A MMR:N/A T-DaP:Given prenatally Flu: N/A Transfusion:No  Physical exam  Vitals:   07/02/20 1318 07/02/20 1827 07/02/20 2136 07/03/20 0517  BP: 121/71 98/63 106/68 99/62  Pulse: (!) 101 (!) 102 89 (!) 105  Resp: 16 18 17 17   Temp: 98.5 F (36.9 C) 98.5 F (36.9 C) 98.1 F (36.7 C) 98.4 F (36.9 C)  TempSrc: Oral Oral Oral Oral  SpO2: 98% 95% 100% 98%   Weight:      Height:       General: alert, cooperative and no distress Lochia: appropriate Uterine Fundus: firm Incision: N/A DVT Evaluation: No evidence of DVT seen on physical exam. Negative Homan's sign. No cords or calf tenderness. Labs: Lab Results  Component Value Date   WBC 7.8 07/01/2020   HGB 12.7 07/01/2020   HCT 39.5 07/01/2020   MCV 90.8 07/01/2020   PLT 260 07/01/2020   No flowsheet data found. Edinburgh Score: Edinburgh Postnatal Depression Scale Screening Tool 07/03/2020  I have been able to laugh and see the funny side of things. 0  I have looked forward with enjoyment to things. 0  I have blamed myself unnecessarily when things went wrong. 0  I have been anxious or worried for no good reason. 1  I have felt scared or panicky for no good reason. 0  Things have been getting on top of me. 1  I have been so unhappy that I have had difficulty sleeping. 0  I have felt sad or miserable. 1  I have been so unhappy that I have been crying. 0  The thought of harming myself has occurred to me. 0  Edinburgh Postnatal Depression Scale Total 3     After visit meds:  Allergies as of 07/03/2020      Reactions   Aspirin Hives   Penicillin G Other (See  Comments)   LOW B/P PSORIASIS GOT WORSE      Medication List    STOP taking these medications   Accu-Chek Guide test strip Generic drug: glucose blood   Accu-Chek Softclix Lancets lancets   AMBULATORY NON FORMULARY MEDICATION   clobetasol ointment 0.05 % Commonly known as: TEMOVATE   Prenatal Vitamin 27-0.8 MG Tabs   Skyrizi (150 MG Dose) 75 MG/0.83ML Pskt Generic drug: Risankizumab-rzaa(150 MG Dose)   Wrist Splint/Cock-Up/Left M Misc   Wrist Splint/Cock-Up/Right M Misc     TAKE these medications   acetaminophen 325 MG tablet Commonly known as: Tylenol Take 2 tablets (650 mg total) by mouth every 4 (four) hours as needed (for pain scale < 4).        Discharge home in stable condition Infant  Feeding: Breast Infant Disposition:home with mother Discharge instruction: per After Visit Summary and Postpartum booklet. Activity: Advance as tolerated. Pelvic rest for 6 weeks.  Diet: routine diet Future Appointments: Future Appointments  Date Time Provider [place]  08/03/2020  9:45 AM Sloan Leiter, MD [facility] None   Follow up Visit:  [place] Follow up on 08/03/2020.   Specialty: Obstetrics and Gynecology Why: for postpartum check up and IUD check Contact information: [place] [id number] [phone]               Please schedule this patient for a In person postpartum visit in 4 weeks with the following provider: Any provider. Additional Postpartum F/U:2 hour GTT (at 4wk post partum appt) High risk pregnancy complicated by: GDM Delivery mode:  Vaginal, Spontaneous  Anticipated Birth Control:  PP IUD placed, string check at post partum appt   07/03/2020 Christin Fudge, CNM

## 2020-07-02 NOTE — Anesthesia Postprocedure Evaluation (Signed)
Anesthesia Post Note  Patient: Nicole King  Procedure(s) Performed: AN AD HOC LABOR EPIDURAL     Patient location during evaluation: Mother Baby Anesthesia Type: Epidural Level of consciousness: awake and alert Pain management: pain level controlled Vital Signs Assessment: post-procedure vital signs reviewed and stable Respiratory status: spontaneous breathing, nonlabored ventilation and respiratory function stable Cardiovascular status: stable Postop Assessment: no headache, no backache and epidural receding Anesthetic complications: no   No complications documented.  Last Vitals:  Vitals:   07/02/20 1027 07/02/20 1318  BP:  121/71  Pulse: (!) 109 (!) 101  Resp:  16  Temp: 37.2 C 36.9 C  SpO2:  98%    Last Pain:  Vitals:   07/02/20 1318  TempSrc: Oral  PainSc:    Pain Goal:                   [employer]

## 2020-07-02 NOTE — Progress Notes (Addendum)
Patient CBG taken at 0122. CBG level 87.  [employer] RN 07/02/20 @0123 

## 2020-07-02 NOTE — Progress Notes (Signed)
LABOR PROGRESS NOTE  Nicole King is a 34 y.o. G1P0 at [redacted]w[redacted]d  admitted for IOL due to A1GDM.   Subjective: Doing well without complaints. Comfortable with epidural.  Objective: BP (!) 82/48 (BP Location: Right Arm) Comment: patient asymptomatic   Pulse 91   Temp 97.7 F (36.5 C) (Oral)   Resp 16   Ht 5' (1.524 m)   Wt 76.2 kg   LMP 10/10/2019 (Within Days)   SpO2 99%   BMI 32.81 kg/m  or  Vitals:   07/02/20 0015 07/02/20 0020 07/02/20 0035 07/02/20 0100  BP: 114/78 109/75 (!) 101/43 (!) 82/48  Pulse: 93 98 97 91  Resp: 18 18 18 16   Temp:      TempSrc:      SpO2:      Weight:      Height:        Dilation: 6 Effacement (%): 90 Cervical Position: Posterior Station: -2 Presentation: Vertex Exam by:: [email or web address] RN  FHT: baseline rate 130, moderate varibility, +acel, -decel Toco: every 1-4 min   Labs: Lab Results  Component Value Date   WBC 7.8 07/01/2020   HGB 12.7 07/01/2020   HCT 39.5 07/01/2020   MCV 90.8 07/01/2020   PLT 260 07/01/2020    Patient Active Problem List   Diagnosis Date Noted  . Pregnancy 07/01/2020  . Diet controlled gestational diabetes mellitus (GDM), antepartum 04/26/2020  . GBS bacteriuria 01/09/2020  . Supervision of normal first pregnancy 01/05/2020  . Psoriasis with arthropathy (HCC) 12/05/2013    Assessment / Plan: 34 y.o. G1P0 at [redacted]w[redacted]d here for IOL due to A1GDM.   Labor: S/p cytotec x3. Made good cervical change since last exam per RN report. Continue pitocin at this time.  Fetal Wellbeing:  Cat 1 strip  Pain Control:  CSE  #GBS bacteruria: urine culture 01/05/20. PCN allergic, clindamycin. Adequate ppx.  #A1GDM: Stable.  q2 CBG given active labor.  #Psorasis: Chronic.  Severe and diffuse. Vaseline and temovate at bedside. Should resume biologics as soon as possible post-partum.   01/07/20, MD OB Fellow, Faculty [facility], Center for [facility] 07/02/2020 1:14 AM

## 2020-07-02 NOTE — Lactation Note (Signed)
This note was copied from a baby's chart. Lactation Consultation Note  Patient Name: Nicole King EHOZY'Y Date: 07/02/2020    [facility] Follow Up:  Per RN, mother does not desire a lactation consult.  Mother will be pumping only and RN will be educating mother on pump set up and routine. LC available as needed if mother requires a future visit.   Maternal Data    Feeding Feeding Type: Bottle Fed - Formula Nipple Type: Slow - flow  LATCH Score                   Interventions    Lactation Tools Discussed/Used     Consult Status      Axton Cihlar R Tameca Jerez 07/02/2020, 2:05 PM

## 2020-07-02 NOTE — Discharge Instructions (Signed)

## 2020-07-02 NOTE — Procedures (Signed)
  Post-Placental IUD Insertion Procedure Note  Patient identified, informed consent signed prior to delivery, signed copy in chart, time out was performed.    Vaginal, labial and perineal areas thoroughly inspected for lacerations. 2nd degree laceration identified - not hemostatic,repaired prior to insertion of IUD.   Paragard  - IUD grasped between sterile gloved fingers. Sterile lubrication applied to sterile gloved hand for ease of insertion. Fundus identified through abdominal wall using non-insertion hand. IUD inserted to fundus with bimanual technique. IUD carefully released.  Strings did not need to be trimmed. Patient tolerated procedure well.  Patient given post procedure instructions and IUD care card with expiration date.  Patient is asked to keep IUD strings tucked in her vagina until her postpartum follow up visit in 4-6 weeks. Patient advised to abstain from sexual intercourse and pulling on strings before her follow-up visit. Patient verbalized an understanding of the plan of care and agrees.   Alric Seton, MD OB Fellow, Faculty [facility], Center for [facility] 07/02/2020 5:26 AM

## 2020-07-03 MED ORDER — ACETAMINOPHEN 325 MG PO TABS: 650.0000 mg | ORAL_TABLET | ORAL | 0 refills | Status: AC | PRN

## 2020-07-03 NOTE — Progress Notes (Signed)
Post Partum Day #1 Subjective: Patient able to ambulate easily. Denies difficulties voiding or tolerating PO. Reports flatus and decreased/minimal bleeding. Adequate pain control with ibuprofen.  Objective: Blood pressure 99/62, pulse (!) 105, temperature 98.4 F (36.9 C), temperature source Oral, resp. rate 17, height 5' (1.524 m), weight 76.2 kg, last menstrual period 10/10/2019, SpO2 98 %, unknown if currently breastfeeding.  Physical Exam:  General: alert and cooperative Uterine Fundus: firm DVT Evaluation: No evidence of DVT seen on physical exam.  Recent Labs    07/01/20 0813  HGB 12.7  HCT 39.5    Assessment/Plan: Discharge home, Circumcision prior to discharge and Contraception paragaurd (done)   LOS: 2 days   Nicole King H Kaylla Cobos 07/03/2020, 7:51 AM

## 2020-08-03 ENCOUNTER — Ambulatory Visit: Payer: Medicaid Other | Admitting: Obstetrics and Gynecology

## 2020-08-16 ENCOUNTER — Ambulatory Visit: Payer: Medicaid Other | Admitting: Family Medicine

## 2020-08-17 ENCOUNTER — Ambulatory Visit: Payer: Medicaid Other | Admitting: Family Medicine

## 2021-06-08 IMAGING — US US MFM FETAL BPP W/O NON-STRESS
1 series · 15 of 26 positions shown · non-contrast
Comparison: none

[Series 1: us mfm fetal bpp w/o non-stress · 26 acquisitions, 15 frames shown]
[im 1/26]
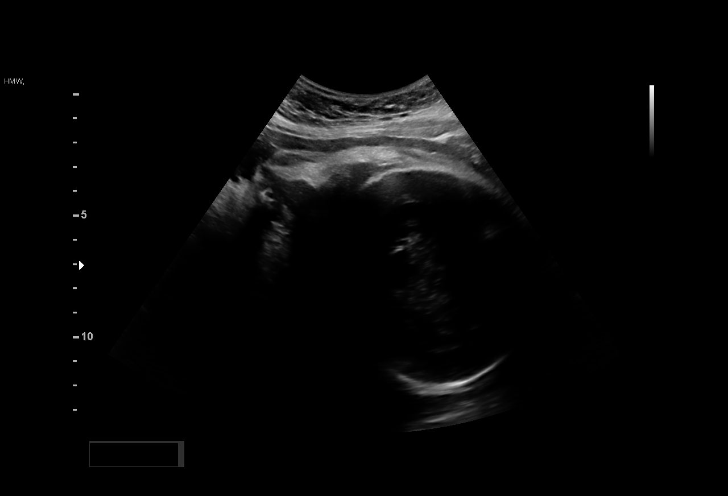
[im 3/26]
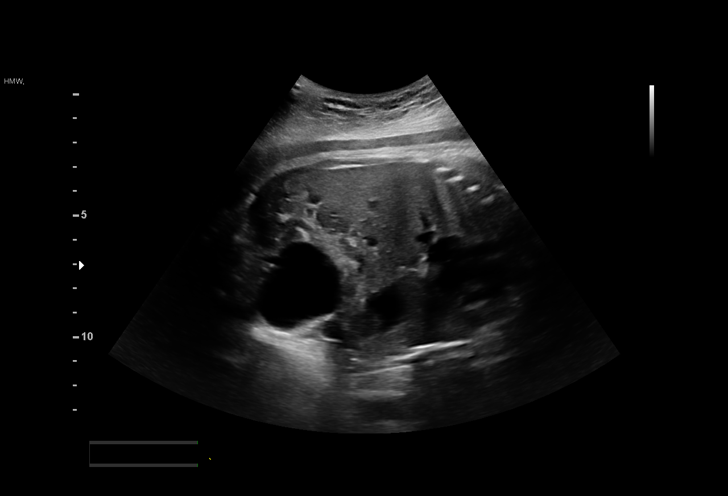
[im 5/26]
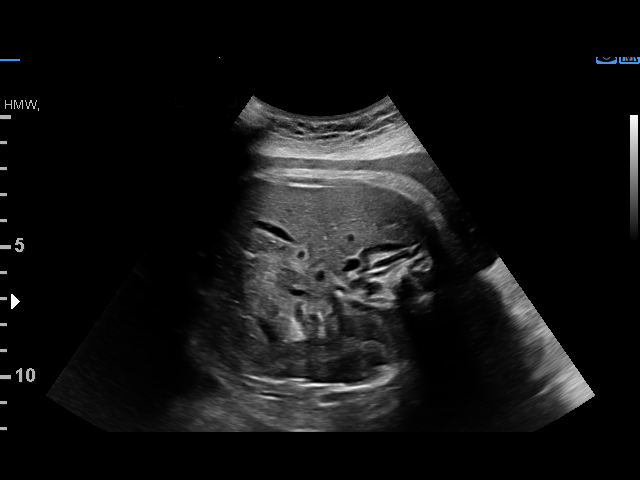
[im 7/26]
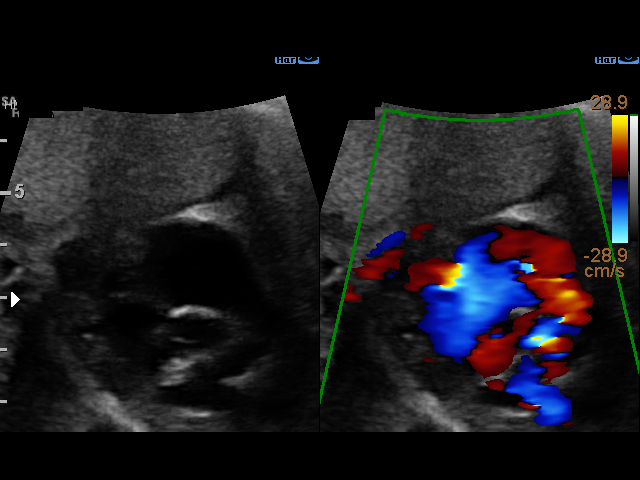
[im 8/26]
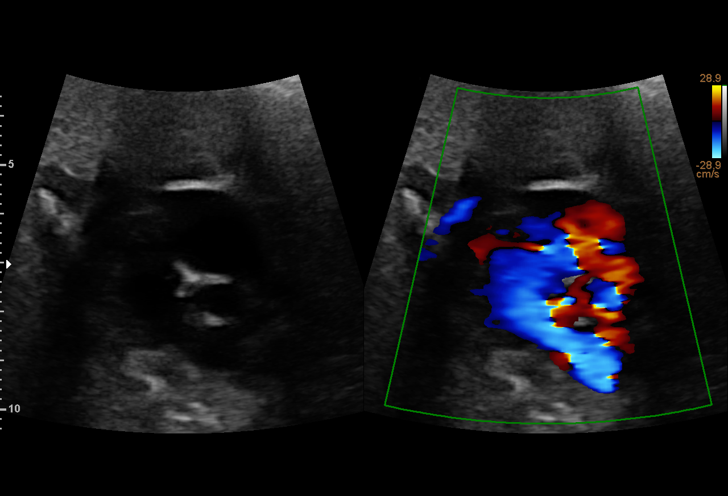
[im 10/26]
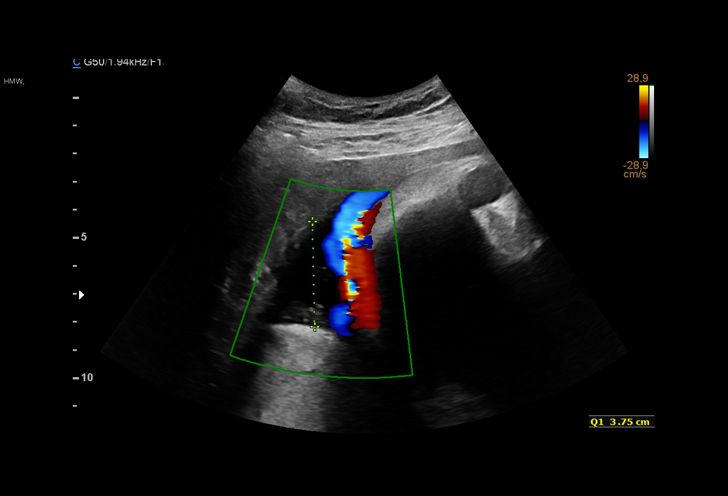
[im 12/26]
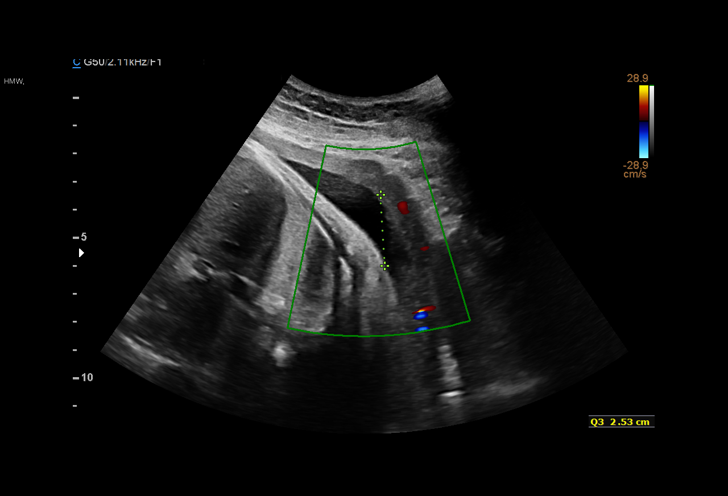
[im 14/26]
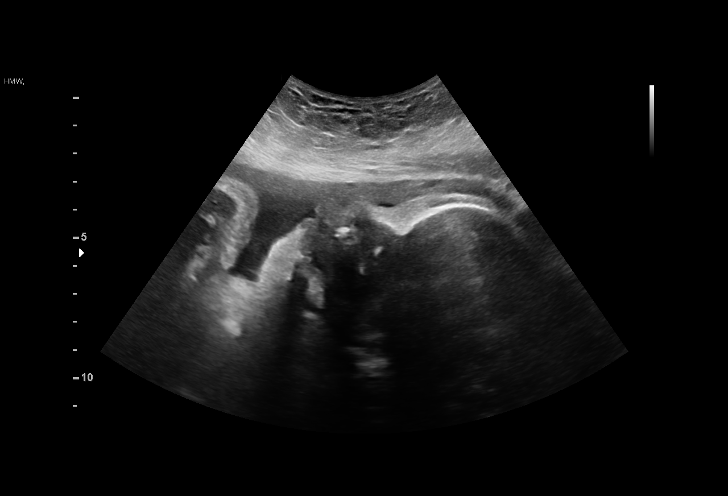
[im 15/26]
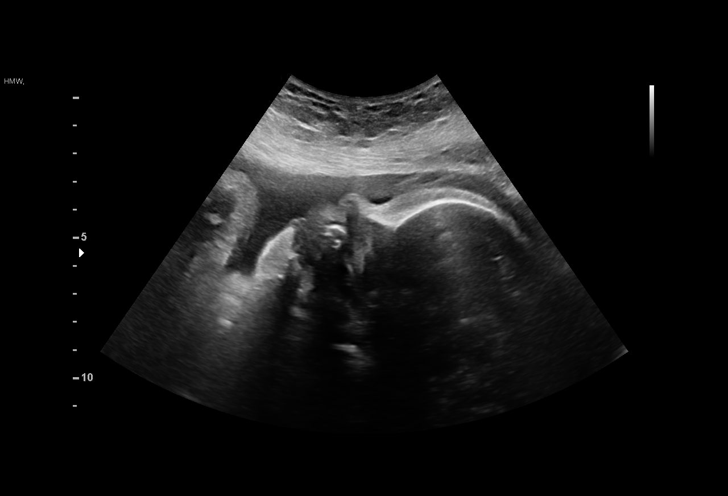
[im 17/26]
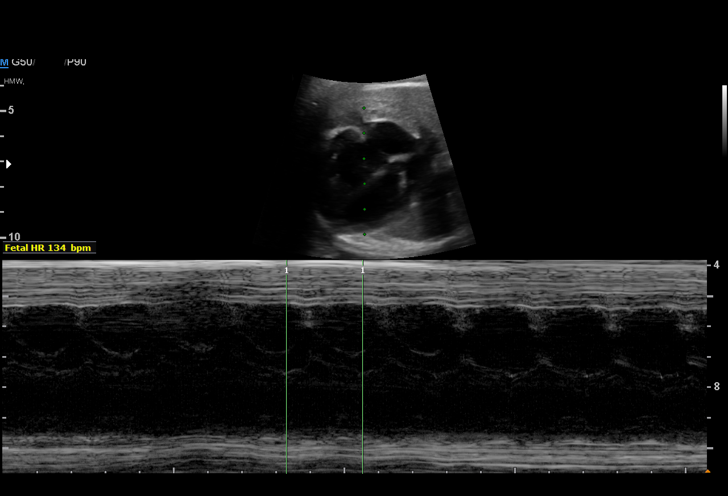
[im 19/26]
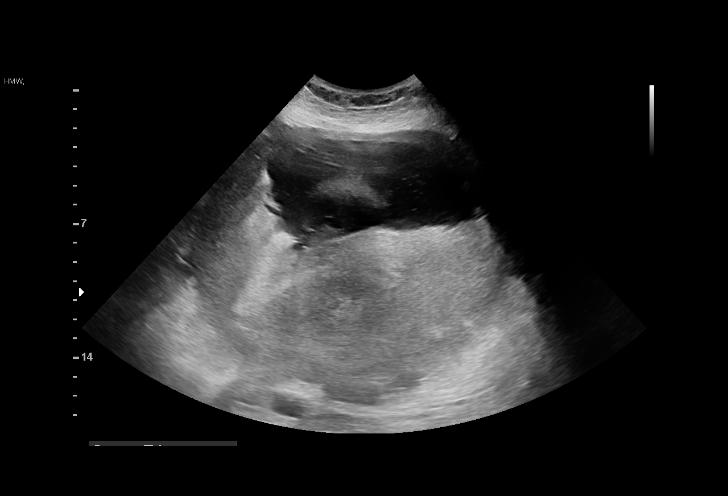
[im 20/26]
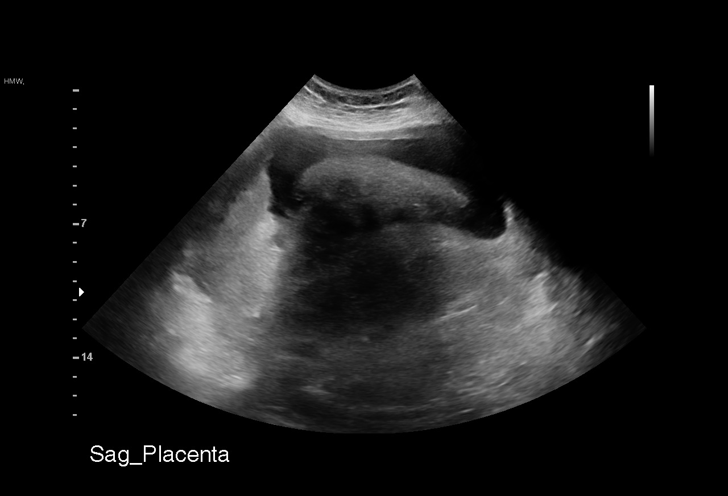
[im 22/26]
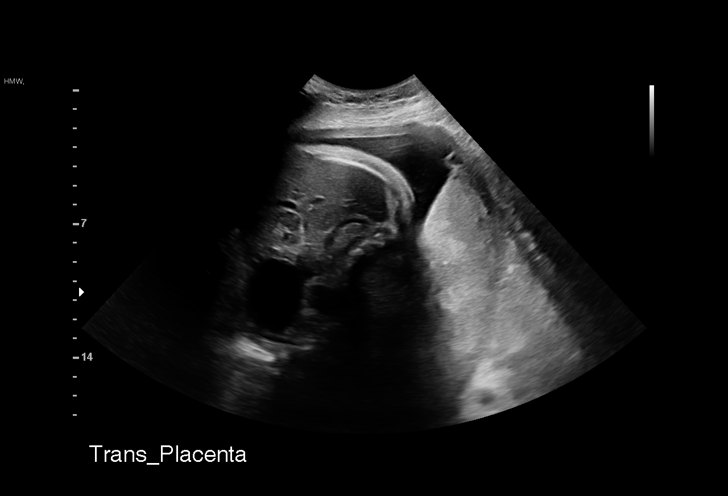
[im 24/26]
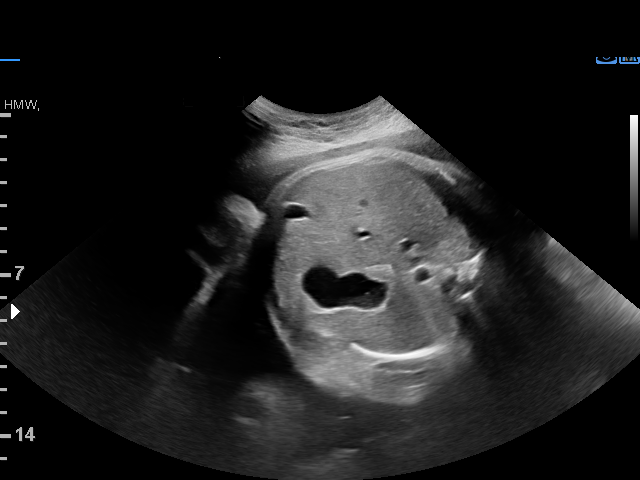
[im 26/26]
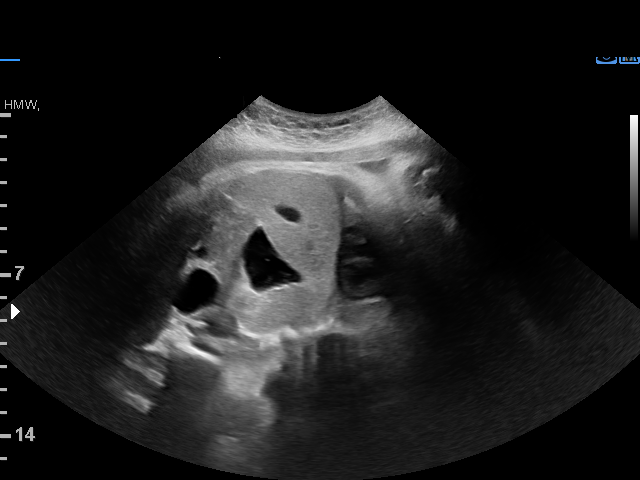

[15 of 26 positions shown; findings below may reference images not displayed]

Indications

 35 weeks gestation of pregnancy
 Encounter for other antenatal screening
 follow-up
 Gestational diabetes in pregnancy,
 controlled by oral hypoglycemic drugs
 Obesity complicating pregnancy, third
 trimester
Fetal Evaluation

 Num Of Fetuses:          1
 Fetal Heart Rate(bpm):   134
 Cardiac Activity:        Observed
 Presentation:            Cephalic
 Placenta:                Posterior

 Amniotic Fluid
 AFI FV:      Within normal limits

 AFI Sum(cm)     %Tile       Largest Pocket(cm)
 15.62           57

 RUQ(cm)       RLQ(cm)       LUQ(cm)        LLQ(cm)

Biophysical Evaluation

 Amniotic F.V:   Within normal limits       F. Tone:         Observed
 F. Movement:    Observed                   Score:           [DATE]
 F. Breathing:   Observed
OB History

 Gravidity:    1         Term:   0        Prem:   0        SAB:   0
 TOP:          0       Ectopic:  0        Living: 0
Gestational Age

 LMP:           33w 2d        Date:  10/10/19                 EDD:   07/16/20
 Best:          35w 3d     Det. By:  Previous Ultrasound      EDD:   07/01/20
                                     (01/05/20)
Anatomy

 Thoracic:              Appears normal         Bladder:                Appears normal
 Stomach:               Appears normal, left
                        sided
Impression

 Antenatal testing performed given maternal 2GI1D
 The biophysical profile was [DATE] with good fetal movement and
 amniotic fluid volume.
Recommendations

 Continue weekly testing.
 Repeat growth at first available appointment.
